# Patient Record
Sex: Female | Born: 1975 | Race: White | Hispanic: No | Marital: Married | Smoking: Current some day smoker
Health system: Southern US, Community
[De-identification: ages and names within clinical notes are randomized; demographics above are authoritative.]

## PROBLEM LIST (undated history)

## (undated) DIAGNOSIS — K603 Anal fistula, unspecified: Secondary | ICD-10-CM

## (undated) DIAGNOSIS — F419 Anxiety disorder, unspecified: Secondary | ICD-10-CM

## (undated) DIAGNOSIS — K219 Gastro-esophageal reflux disease without esophagitis: Secondary | ICD-10-CM

## (undated) DIAGNOSIS — T7840XA Allergy, unspecified, initial encounter: Secondary | ICD-10-CM

## (undated) HISTORY — DX: Anxiety disorder, unspecified: F41.9

## (undated) HISTORY — PX: LASER ABLATION: SHX1947

## (undated) HISTORY — DX: Anal fistula: K60.3

## (undated) HISTORY — DX: Allergy, unspecified, initial encounter: T78.40XA

## (undated) HISTORY — PX: TUBAL LIGATION: SHX77

## (undated) HISTORY — DX: Anal fistula, unspecified: K60.30

---

## 1998-06-11 ENCOUNTER — Other Ambulatory Visit: Admission: RE | Admit: 1998-06-11 | Discharge: 1998-06-11 | Payer: Self-pay | Admitting: Obstetrics and Gynecology

## 1998-10-29 ENCOUNTER — Ambulatory Visit (HOSPITAL_COMMUNITY): Admission: RE | Admit: 1998-10-29 | Discharge: 1998-10-29 | Payer: Self-pay | Admitting: Obstetrics and Gynecology

## 1998-10-29 ENCOUNTER — Encounter: Payer: Self-pay | Admitting: Obstetrics and Gynecology

## 1999-03-11 ENCOUNTER — Inpatient Hospital Stay (HOSPITAL_COMMUNITY): Admission: AD | Admit: 1999-03-11 | Discharge: 1999-03-11 | Payer: Self-pay | Admitting: Obstetrics and Gynecology

## 1999-03-27 ENCOUNTER — Inpatient Hospital Stay (HOSPITAL_COMMUNITY): Admission: AD | Admit: 1999-03-27 | Discharge: 1999-03-29 | Payer: Self-pay | Admitting: Obstetrics and Gynecology

## 1999-03-30 ENCOUNTER — Encounter: Admission: RE | Admit: 1999-03-30 | Discharge: 1999-05-18 | Payer: Self-pay | Admitting: Obstetrics and Gynecology

## 2000-05-30 ENCOUNTER — Other Ambulatory Visit: Admission: RE | Admit: 2000-05-30 | Discharge: 2000-05-30 | Payer: Self-pay | Admitting: Obstetrics and Gynecology

## 2000-11-15 ENCOUNTER — Encounter: Payer: Self-pay | Admitting: Internal Medicine

## 2000-11-15 ENCOUNTER — Ambulatory Visit (HOSPITAL_COMMUNITY): Admission: RE | Admit: 2000-11-15 | Discharge: 2000-11-15 | Payer: Self-pay | Admitting: Internal Medicine

## 2001-07-27 ENCOUNTER — Other Ambulatory Visit: Admission: RE | Admit: 2001-07-27 | Discharge: 2001-07-27 | Payer: Self-pay | Admitting: Obstetrics and Gynecology

## 2002-08-06 ENCOUNTER — Other Ambulatory Visit: Admission: RE | Admit: 2002-08-06 | Discharge: 2002-08-06 | Payer: Self-pay | Admitting: Obstetrics and Gynecology

## 2003-08-08 ENCOUNTER — Other Ambulatory Visit: Admission: RE | Admit: 2003-08-08 | Discharge: 2003-08-08 | Payer: Self-pay | Admitting: Obstetrics and Gynecology

## 2004-08-18 ENCOUNTER — Other Ambulatory Visit: Admission: RE | Admit: 2004-08-18 | Discharge: 2004-08-18 | Payer: Self-pay | Admitting: Obstetrics and Gynecology

## 2005-08-19 ENCOUNTER — Other Ambulatory Visit: Admission: RE | Admit: 2005-08-19 | Discharge: 2005-08-19 | Payer: Self-pay | Admitting: Obstetrics and Gynecology

## 2008-02-18 ENCOUNTER — Inpatient Hospital Stay (HOSPITAL_COMMUNITY): Admission: AD | Admit: 2008-02-18 | Discharge: 2008-02-18 | Payer: Self-pay | Admitting: Obstetrics and Gynecology

## 2008-05-28 ENCOUNTER — Ambulatory Visit (HOSPITAL_COMMUNITY): Admission: RE | Admit: 2008-05-28 | Discharge: 2008-05-28 | Payer: Self-pay | Admitting: Obstetrics and Gynecology

## 2010-03-01 HISTORY — PX: ANAL FISSURE REPAIR: SHX2312

## 2010-06-02 ENCOUNTER — Encounter (HOSPITAL_BASED_OUTPATIENT_CLINIC_OR_DEPARTMENT_OTHER)
Admission: RE | Admit: 2010-06-02 | Discharge: 2010-06-02 | Disposition: A | Discharge status: Home / Self Care | Payer: BC Managed Care – PPO | Source: Ambulatory Visit | Attending: General Surgery | Admitting: General Surgery

## 2010-06-02 LAB — DIFFERENTIAL
Basophils Relative: 0 % (ref 0–1)
Eosinophils Relative: 2 % (ref 0–5)
Monocytes Absolute: 0.6 10*3/uL (ref 0.1–1.0)
Monocytes Relative: 6 % (ref 3–12)
Neutro Abs: 7 10*3/uL (ref 1.7–7.7)

## 2010-06-02 LAB — CBC
HCT: 38.9 % (ref 36.0–46.0)
Hemoglobin: 12.9 g/dL (ref 12.0–15.0)
MCH: 28.7 pg (ref 26.0–34.0)
MCHC: 33.2 g/dL (ref 30.0–36.0)
RDW: 13.9 % (ref 11.5–15.5)

## 2010-06-04 ENCOUNTER — Ambulatory Visit (HOSPITAL_BASED_OUTPATIENT_CLINIC_OR_DEPARTMENT_OTHER)
Admission: RE | Admit: 2010-06-04 | Discharge: 2010-06-04 | Disposition: A | Discharge status: Home / Self Care | Payer: BC Managed Care – PPO | Source: Ambulatory Visit | Attending: General Surgery | Admitting: General Surgery

## 2010-06-04 DIAGNOSIS — K602 Anal fissure, unspecified: Secondary | ICD-10-CM | POA: Insufficient documentation

## 2010-06-11 LAB — CBC
HCT: 40.4 % (ref 36.0–46.0)
MCV: 89.6 fL (ref 78.0–100.0)
Platelets: 209 10*3/uL (ref 150–400)
RDW: 12.7 % (ref 11.5–15.5)

## 2010-06-11 LAB — PREGNANCY, URINE: Preg Test, Ur: NEGATIVE

## 2010-06-16 NOTE — Op Note (Signed)
  NAMECALLEN, ZUBA NO.:  [id number]  MEDICAL RECORD NO.:  [id number]          PATIENT TYPE:  LOCATION:                                 FACILITY:  PHYSICIAN:  Almond Lint, MD            DATE OF BIRTH:  DATE OF PROCEDURE:  06/04/2010 DATE OF DISCHARGE:                              OPERATIVE REPORT   PREOPERATIVE DIAGNOSIS:  Anal fissure.  POSTOPERATIVE DIAGNOSIS:  Anal fissure.  PROCEDURE PERFORMED:  Examine under anesthesia, lateral sphincterotomy and fissurectomy.  SURGEON:  Almond Lint, MD  ASSISTANT:  None.  ANESTHESIA:  General and local.  FINDINGS:  Posteriorly located chronic fissure.  SPECIMEN:  None.  ESTIMATED BLOOD LOSS:  Minimal.  COMPLICATIONS:  None known.  PROCEDURE:  Ms. Harland Dingwall was identified in the holding area was taken to the operating room where she was placed supine on the operating room table.  General anesthesia was induced.  She was placed into the low lithotomy position.  Her perineum was prepped and draped in standard fashion.  A time-out was performed according to the surgical safety check list.  When all was correct, we continued.  The anus was examined digitally first and there were no masses located.  With a speculum, there were seen to be circumferential noninflamed hemorrhoids. Posteriorly, there was a small fissure that was quite fibrotic and this was excised with Metzenbaum scissors.  The mucosa was reapproximated with 3-0 chromic.  At this point, a stab incision was made with a #11 blade on the left lateral aspect.  The fibrotic portion of the sphincter was identified and cut with Metzenbaum scissors.  The area was infiltrated with local anesthetic.  The anus was packed with Gelfoam with dibucaine ointment and the external portion of the procedure was covered with dibucaine ointment as well.  The patient was then awakened from anesthesia and taken to the PACU in stable condition.  Needle and sponge counts  were correct.     Almond Lint, MD     FB/MEDQ  D:  06/04/2010  T:  06/05/2010  Job:  [id number]  Electronically Signed by Almond Lint MD on 06/16/2010 09:44:16 AM

## 2010-07-14 NOTE — Op Note (Signed)
Amber Blankenship, Amber Blankenship NO.:  [id number]   MEDICAL RECORD NO.:  [id number]          PATIENT TYPE:  AMB   LOCATION:  SDC                           FACILITY:  [facility]   PHYSICIAN:  Zenaida Niece, M.D.DATE OF BIRTH:  January 22, 1976   DATE OF PROCEDURE:  05/28/2008  DATE OF DISCHARGE:                               OPERATIVE REPORT   PREOPERATIVE DIAGNOSIS:  Desires surgical sterility and menorrhagia.   POSTOPERATIVE DIAGNOSES:  1. Desires surgical sterility and menorrhagia.  2. Small uterine myoma.   PROCEDURE:  Laparoscopic bilateral tubal fulguration and NovaSure  endometrial ablation.   SURGEON:  Zenaida Niece, M.D.   ANESTHESIA:  General, endotracheal tube, and paracervical block.   FINDINGS:  She had normal abdomen and pelvis by laparoscopy, except for  a small fundal myoma.  The NovaSure device used a depth of 5 cm, a width  of 3.5 cm and used 96 watts per 65 seconds.   SPECIMENS:  None.   ESTIMATED BLOOD LOSS:  Minimal.   COMPLICATIONS:  None.   PROCEDURE IN DETAIL:  The patient was taken in the operating room and  placed in the dorsal supine position.  General anesthesia was induced,  left arm was took to her side and legs were placed in mobile stirrups.  Abdomen, perineum, and vagina were then prepped and draped in the usual  sterile fashion, bladder drained with a red Robinson catheter, Hulka  tenaculum applied to the cervix for uterine manipulation.  Infraumbilical skin was then infiltrated with 0.25% Marcaine and a 1 cm  vertical incision was made.  The Veress needle was then inserted into  the peritoneal cavity and placement confirmed by the water drop test and  an opening pressure of 5 mmHg.  CO2 gas was insufflated to a pressure of  14 mmHg and the Veress needle was removed.  A 10/11 disposable trocar  was then introduced with direct visualization with the laparoscope.  The  operating scope was then inserted.  Good visualization was achieved  with  normal pelvis and abdomen.  Both fallopian tubes were identified and  traced to their fimbriated ends.  The middle portion of each tube was  fulgurated with bipolar cautery at 3 separate spots until the amp meter  read 0 in all spots.  This achieved good fulguration.  Of note, she does  have a small anterior fundal serosal myoma.  At this point, the  laparoscopy portion was terminated.  The laparoscope was removed and all  gas allowed to deflate from the abdomen.  The trocar was removed.  Skin  incision was closed with interrupted subcuticular sutures of 4-0 Vicryl  followed by Dermabond.   Attention was turned to the NovaSure.  Legs were elevated in stirrups.  The Hulka tenaculum was removed.  A Graves speculum was inserted into  the vagina.  Deep paracervical block was then performed with 16 cc of 2%  plain lidocaine after a single-tooth tenaculum was placed on the  anterior portion of the cervix.  The uterus then sounded to 8.5 cm.  Cervix was gradually dilated to accommodate  a size 7 Hegar dilator,  which measured the cervix at 3.5 cm.  Cervix was further dilated to a  size 8 Hegar dilator which was then removed.  The NovaSure device was  easily inserted and appropriately deployed.  CO2 test passed and  endometrial ablation was performed without difficulty with the above-  mentioned settings.  The NovaSure device was then removed intact.  The  single-tooth tenaculum was removed from the cervix and bleeding was  controlled with pressure.  All instruments were then removed from the  vagina.  The patient tolerated the procedure well.  She was awakened in  the operating room and taken to the recovery room in stable condition.  Counts were correct x2 and she had PAS hose on throughout the procedure.      Zenaida Niece, M.D.  Electronically Signed     TDM/MEDQ  D:  05/28/2008  T:  05/29/2008  Job:  [id number]

## 2010-07-17 NOTE — Discharge Summary (Signed)
[facility]  Patient:    Amber Blankenship                       MRN: [id number] Adm. Date:  [id number] Disc. Date: [id number] Attending:  Malon Kindle                           Discharge Summary  HISTORY OF PRESENT ILLNESS:   A 35 year old white married female, para 1-0-0-1 gravida 2, last period June 19, 1998; [facility] March 28, 1999 by dates and March 31, 1999 by ultrasound; admitted with ruptured membranes with light meconium. Blood group and type O positive with a negative antibody, nonreactive serology,  rubella positive, hepatitis B surface antigen negative, HIV negative, GC and chlamydia negative, triple screen normal, one-hour Glucola 144, three-hour GTT 103/165/137/114.  Vaginal ultrasound on August 26, 1998 showed a crown/rump length of 2.2 cm, 9 weeks 0 days, [facility] March 31, 1999.  There was a 6 x 6 cm left ovarian cyst.  On September 29, 1998, left ovarian simple cyst was 5.8 x 5.5 cm.  On August 0, 2000, ultrasound showed an average gestational age of [redacted] weeks 4 days, [facility] March 29, 1999.  No ovarian cyst was seen.  The patient was treated with iron for anemia, and Zantac for reflux.  Vaginal infections were treated with Terazol and MetroGel. She was seen in the office on the day of admission, complaining of pelvic pressure. Cervix was a fingertip dilated, 30%, vertex, at a -3 station.  She was sent to he hospital for a nonstress test.  During the nonstress test, the patient had spontaneous rupture of membranes, with light meconium noted when I did an amniotomy on labor and delivery.  She began contracting every three minutes.  PAST MEDICAL HISTORY:  ALLERGIES:                    ENTEX LA and PENICILLIN.  ILLNESS:                      Condyloma.  OPERATIONS:                   None.  ALCOHOL, TOBACCO, AND DRUGS:  None.  INJURIES:                     Break in the third grade.  OBSTETRIC HISTORY:            March 1998 -  Vaginal delivery of an 8 pound 9 ounce female, increased blood pressure in pregnancy and positive group B strep.  PRESENTATION ON ADMISSION:    Her blood pressure was 134/83, pulse was 84 and temperature 97.6.  The abdomen was soft, the fundal height was 41 cm, fetal heart tones were normal.  The cervix was 4 cm, 70%, vertex, at a -3 station. In spite of prior spontaneous rupture of membranes, amniotomy was done for a bulging bag of waters, light meconium-stained fluid was found.  IMPRESSIONS:                  1. Intrauterine pregnancy at 39+ weeks.                               2. Early labor.  3. Prior positive group B strep.  HOSPITAL COURSE:              Patient was placed on IV clindamycin and given an  epidural.  She progressed to full dilatation, and showed a grapefruit-sized caput. A left mediolateral episiotomy was cut, and then with Woods screw maneuver, McRoberts maneuver, and suprapubic pressure, delivered a living female infant, 8 pounds 12 ounces, with Apgars of 8 at one and 9 at five minutes.  DeLee was used with the head on the perineum.  Only 2 cc of bloody mucous was obtained. Dr. Mikle Bosworth was in attendance, but because of the babys vigor did not look at the cords.  Placenta was intact.  Uterus was normal, rectal negative.  Left mediolateral episiotomy repaired with 3-0 Dexon.  Blood loss about 500 cc. Dr. Ambrose Mantle was in attendance.  Postpartum, the patient did quite well. However, on the day after delivery, the baby began to have some rapid breathing, and was evaluated and felt to be better served to be in the NICU, and will need to be in the hospital for at least seven days for treatment of infection.  The etiology f infection is unclear.  LABORATORY DATA:              On admission, the patients hemoglobin was 13.3; hematocrit 38.6; white count 10,800; platelet count 183,000.  RPR was nonreactive. Follow-up hemoglobin was 11.0;  hematocrit 31.3; platelet count 151,000; white count 14,700.  FINAL DIAGNOSES:              1. Intrauterine pregnancy at 39+ weeks, delivered                                  occiput anterior.                               2. Mild shoulder dystocia.                               3. Positive group B streptococcus.  OPERATIONS:                   1. Spontaneous delivery OA.                               2. Left mediolateral episiotomy and repair.  FINAL CONDITION:              Improved.  INSTRUCTIONS:                 Include our regular discharge instruction booklet.  MEDICATIONS:                  Tylenol #3 12 tablets one q.4-6h. if needed for pain but no refills.  FOLLOW-UP:                    The patient is advised to return to the office in six weeks for follow-up examination. DD:  03/29/99 TD:  03/30/99 Job: [id number]

## 2010-08-28 ENCOUNTER — Ambulatory Visit (INDEPENDENT_AMBULATORY_CARE_PROVIDER_SITE_OTHER): Payer: BC Managed Care – PPO | Admitting: General Surgery

## 2010-08-28 ENCOUNTER — Encounter (INDEPENDENT_AMBULATORY_CARE_PROVIDER_SITE_OTHER): Payer: Self-pay | Admitting: General Surgery

## 2010-08-28 DIAGNOSIS — K602 Anal fissure, unspecified: Secondary | ICD-10-CM | POA: Insufficient documentation

## 2010-08-28 DIAGNOSIS — K649 Unspecified hemorrhoids: Secondary | ICD-10-CM | POA: Insufficient documentation

## 2010-08-28 MED ORDER — HYDROCORTISONE 2.5 % RE CREA: TOPICAL_CREAM | Freq: Two times a day (BID) | RECTAL | Status: AC

## 2010-08-28 NOTE — Assessment & Plan Note (Signed)
Anusol HC BID. Continue stool softeners Sitz baths BID Follow up with me in 6 weeks

## 2010-08-28 NOTE — Progress Notes (Signed)
Subjective:     Patient ID: Amber Blankenship, female   DOB: [birth year], 35 y.o.   MRN: [id number]    Pulse 80  Temp 97.7 F (36.5 C)    HPI Patient is status post lateral sphincterotomy and fissurectomy on 45 2012 for anal fissure. She had been doing significantly better but now continues to have some soreness. She had improving significantly but now has started to bother her again. She has had some bleeding and some swelling. She has been aggressive with her stool softeners and is not constipated . She does strain to have bowel movements and is not spent a lot of time on the toilet. She is knowing her having had a sharp stabbing pain that she had with her fissure.   History reviewed. No pertinent past medical history.  Past Surgical History  Procedure Date  . Bilateral spihincterotomy     Outpatient Encounter Prescriptions as of 08/28/2010  Medication Sig Dispense Refill  . PHENTERMINE HCL PO Take by mouth daily.        . hydrocortisone (ANUSOL-HC) 2.5 % rectal cream Place rectally 2 (two) times daily.  60 g  0    Allergies  Allergen Reactions  . Penicillins Itching    History reviewed. No pertinent family history.  History   Social History  . Marital Status: Divorced    Spouse Name: N/A    Number of Children: N/A  . Years of Education: N/A   Occupational History  . Not on file.   Social History Main Topics  . Smoking status: Former Games developer  . Smokeless tobacco: Not on file  . Alcohol Use: No  . Drug Use: No  . Sexually Active:    Other Topics Concern  . Not on file   Social History Narrative  . No narrative on file    Review of Systems     Objective:   Physical Exam  Constitutional: She appears well-developed and well-nourished.  perianal exam demonstrates external hemorrhoids on the right     Assessment:       Plan:

## 2010-10-14 ENCOUNTER — Encounter (INDEPENDENT_AMBULATORY_CARE_PROVIDER_SITE_OTHER): Payer: Self-pay | Admitting: General Surgery

## 2010-10-14 ENCOUNTER — Ambulatory Visit (INDEPENDENT_AMBULATORY_CARE_PROVIDER_SITE_OTHER): Payer: BC Managed Care – PPO | Admitting: General Surgery

## 2010-10-14 ENCOUNTER — Encounter (INDEPENDENT_AMBULATORY_CARE_PROVIDER_SITE_OTHER): Payer: Self-pay

## 2010-10-14 DIAGNOSIS — K649 Unspecified hemorrhoids: Secondary | ICD-10-CM

## 2010-10-14 NOTE — Assessment & Plan Note (Signed)
Skin overlying hemorrhoids appear raw at the anal verge.   Apply desitin or other diaper rash cream BID or PRN. Pain mostly occurs with wiping, so use water bottle to clean after bowel movement and dab to clean.

## 2010-10-14 NOTE — Progress Notes (Signed)
Amber Blankenship is a 35 y.o. female.    Chief Complaint  Patient presents with  . Other    PO sphincterectomy    HPI HPI Amber Blankenship is still having pain with bowel movements, occasionally a 7-8/10, usually a 3-5/10.  Amber Blankenship has been taking 250 mg colace per day.  Amber Blankenship is only having a hard stool occasionally and has 2-3/day.  Amber Blankenship denies sitting on the toilet for extended periods of time.  Amber Blankenship has been using the hemorrhoid cream I prescribed.    Past Medical History  Diagnosis Date  . Hemorrhoids   . Anxiety   . Anal fistula     Past Surgical History  Procedure Date  . Bilateral spihincterotomy   . Tubal ligation     History reviewed. No pertinent family history.  Social History History  Substance Use Topics  . Smoking status: Former Games developer  . Smokeless tobacco: Not on file  . Alcohol Use: No    Allergies  Allergen Reactions  . Penicillins Itching    Current Outpatient Prescriptions  Medication Sig Dispense Refill  . PROCTOSOL HC 2.5 % rectal cream Ad lib.        Review of Systems ROS  Physical Exam Physical Exam  Constitutional: Amber Blankenship is oriented to person, place, and time. Amber Blankenship appears well-developed and well-nourished. No distress.  HENT:  Head: Normocephalic and atraumatic.  Eyes: Conjunctivae are normal. Pupils are equal, round, and reactive to light. No scleral icterus.  Respiratory: Effort normal. No respiratory distress.  Genitourinary: Rectal exam shows internal hemorrhoid (R hemorrhoids with irritation at anal verge.).  Neurological: Amber Blankenship is alert and oriented to person, place, and time. Coordination normal.  Skin: Skin is warm and dry. No rash noted. Amber Blankenship is not diaphoretic. No erythema.  Psychiatric: Amber Blankenship has a normal mood and affect. Amber Blankenship behavior is normal. Judgment and thought content normal.     There were no vitals taken for this visit.  Assessment/Plan Hemorrhoids Skin overlying hemorrhoids appear raw at the anal verge.   Apply desitin or other  diaper rash cream BID or PRN. Pain mostly occurs with wiping, so use water bottle to clean after bowel movement and dab to clean.         Amber Blankenship 10/14/2010, 11:21 AM

## 2010-12-04 LAB — COMPREHENSIVE METABOLIC PANEL
ALT: 13 U/L (ref 0–35)
AST: 16 U/L (ref 0–37)
Albumin: 3.1 g/dL — ABNORMAL LOW (ref 3.5–5.2)
Alkaline Phosphatase: 64 U/L (ref 39–117)
Potassium: 4 mEq/L (ref 3.5–5.1)
Sodium: 136 mEq/L (ref 135–145)
Total Protein: 6.3 g/dL (ref 6.0–8.3)

## 2010-12-04 LAB — URINALYSIS, ROUTINE W REFLEX MICROSCOPIC
Glucose, UA: NEGATIVE mg/dL
Ketones, ur: NEGATIVE mg/dL
Protein, ur: NEGATIVE mg/dL

## 2010-12-04 LAB — CBC
Platelets: 197 10*3/uL (ref 150–400)
RDW: 13.9 % (ref 11.5–15.5)

## 2010-12-04 LAB — URINE MICROSCOPIC-ADD ON

## 2011-06-15 ENCOUNTER — Encounter: Payer: Self-pay | Admitting: Urgent Care

## 2011-06-15 ENCOUNTER — Ambulatory Visit (INDEPENDENT_AMBULATORY_CARE_PROVIDER_SITE_OTHER): Payer: BC Managed Care – PPO | Admitting: Urgent Care

## 2011-06-15 VITALS — BP 110/70 | HR 69 | Temp 98.0°F | Ht 65.0 in | Wt 199.8 lb

## 2011-06-15 DIAGNOSIS — R109 Unspecified abdominal pain: Secondary | ICD-10-CM

## 2011-06-15 DIAGNOSIS — R1010 Upper abdominal pain, unspecified: Secondary | ICD-10-CM

## 2011-06-15 DIAGNOSIS — K219 Gastro-esophageal reflux disease without esophagitis: Secondary | ICD-10-CM

## 2011-06-15 NOTE — Assessment & Plan Note (Addendum)
Amber Blankenship is a pleasant 36 y.o. female with acute onset severe post-prandial upper abdominal & chest pain.  Differentials include gallbladder disease, GERD, gastritis, PUD or pancreatitis.  Begin Nexium 40mg  daily Abdominal ultrasound ASAP CBC, LFTS, lipase now ABD Pain info  To ER if severe pain, nausea or vomiting.

## 2011-06-15 NOTE — Assessment & Plan Note (Signed)
Minimal heartburn couple times per month.  Now w/ acute onset upper abdominal pain.

## 2011-06-15 NOTE — Progress Notes (Signed)
Primary Care Physician:  Lynne Leader, MD, MD Primary Gastroenterologist:  Dr. Jena Gauss  Chief Complaint  Patient presents with  . Gastrophageal Reflux    pain in the center of chest     HPI:  Amber Blankenship is a 36 y.o. female here as a new patient for evaluation of acute upper abdominal pain.  She tells me shortly after dinner last pm, she developed a severe sharp pain.  She had previously had occasional heartburn, but this is nothing like she has had before.  She ate beef roast & potatoes.  c/o persistent chest tightness & a sharp pain just below her bra, worse on the right.  The pain brought tears to hear eyes.  Dull epigastric pain persists today.  She had a normal BM this am.  She took Zantac last night with minimal help.  C/o SOB last night, but this has resolved today.  c/o nausea, but no vomiting.  No rectal bleeding or melena.  Appetite had been ok before this pain, but now with anorexia.  No fever. + chills.  Weight has been stable.     Past Medical History  Diagnosis Date  . Hemorrhoids   . Anxiety   . Anal fistula     Past Surgical History  Procedure Date  . Tubal ligation   . Laser ablation     uterine    Current Outpatient Prescriptions  Medication Sig Dispense Refill  . docusate sodium (COLACE) 100 MG capsule Take 100 mg by mouth 2 (two) times daily.      . polycarbophil (FIBERCON) 625 MG tablet Take 625 mg by mouth daily.      . ranitidine (ZANTAC) 150 MG capsule Take 150 mg by mouth 2 (two) times daily.        Allergies as of 06/15/2011 - Review Complete 06/15/2011  Allergen Reaction Noted  . Penicillins Itching 08/28/2010    Family History:  There is no known family history of colorectal carcinoma , liver disease, or inflammatory bowel disease.  History   Social History  . Marital Status: Married    Spouse Name: N/A    Number of Children: 2  . Years of Education: N/A   Occupational History  . 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 Smoking status: Never Smoker   . Smokeless tobacco: Not on file  . Alcohol Use: No  . Drug Use: No  . Sexually Active: Not on file  Review of Systems: Gen: See HPI CV: Denies palpitations, syncope, orthopnea, PND, peripheral edema, and claudication. Resp: Denies dyspnea at rest, dyspnea with exercise, cough, sputum, wheezing, coughing up blood, and pleurisy. GI: Denies vomiting blood, jaundice, and fecal incontinence.Denies dysphagia or odynophagia. GU : Denies urinary burning, blood in urine, urinary frequency, urinary hesitancy, nocturnal urination, and urinary incontinence. MS: Denies joint pain, limitation of movement, and swelling, stiffness, low back pain, extremity pain. Denies muscle weakness, cramps, atrophy.  Derm: Denies rash, itching, dry skin, hives, moles, warts, or unhealing ulcers.  Psych: Denies depression, anxiety, memory loss, suicidal ideation, hallucinations, paranoia, and confusion. Heme: Denies bruising, bleeding, and enlarged lymph nodes. Neuro:  Denies any headaches, dizziness, paresthesias. Endo:  Denies any problems with DM, thyroid, adrenal function.  Physical Exam: BP 110/70  Pulse 69  Temp(Src) 98 F (36.7 C) (Temporal)  Ht 5\' 5"  (1.651 m)  Wt 199 lb 12.8 oz (90.629 kg)  BMI 33.25 kg/m2 General:   Alert,  Well-developed, well-nourished, pleasant and  cooperative in NAD Head:  Normocephalic and atraumatic. Eyes:  Sclera clear, no icterus.   Conjunctiva pink. Ears:  Normal auditory acuity. Nose:  No deformity, discharge, or lesions. Mouth:  No deformity or lesions,oropharynx pink & moist. Neck:  Supple; no masses or thyromegaly. Lungs:  Clear throughout to auscultation.   No wheezes, crackles, or rhonchi. No acute distress. Heart:  Regular rate and rhythm; no murmurs, clicks, rubs,  or gallops. Abdomen:  Normal bowel sounds.  No bruits.  Soft and non-distended without masses, hepatosplenomegaly or hernias noted.  +Murphy's point  tenderness.  No guarding or rebound tenderness.   Rectal:  Deferred. Msk:  Symmetrical without gross deformities. Normal posture. Pulses:  Normal pulses noted. Extremities:  No clubbing or edema. Neurologic:  Alert and  oriented x4;  grossly normal neurologically. Skin:  Intact without significant lesions or rashes. Lymph Nodes:  No significant cervical adenopathy. Psych:  Alert and cooperative. Normal mood and affect.

## 2011-06-15 NOTE — Patient Instructions (Signed)
Begin Nexium 40mg  daily Go to lab now I will call you with ultrasound & lab results Abdominal Pain Abdominal pain can be caused by many things. Your caregiver decides the seriousness of your pain by an examination and possibly blood tests and X-rays. Many cases can be observed and treated at home. Most abdominal pain is not caused by a disease and will probably improve without treatment. However, in many cases, more time must pass before a clear cause of the pain can be found. Before that point, it may not be known if you need more testing, or if hospitalization or surgery is needed. HOME CARE INSTRUCTIONS   Do not take laxatives unless directed by your caregiver.   Take pain medicine only as directed by your caregiver.   Only take over-the-counter or prescription medicines for pain, discomfort, or fever as directed by your caregiver.   Try a clear liquid diet (broth, tea, or water) for as long as directed by your caregiver. Slowly move to a bland diet as tolerated.  SEEK IMMEDIATE MEDICAL CARE IF:   The pain does not go away.   You have a fever.   You keep throwing up (vomiting).   The pain is felt only in portions of the abdomen. Pain in the right side could possibly be appendicitis. In an adult, pain in the left lower portion of the abdomen could be colitis or diverticulitis.   You pass bloody or black tarry stools.  MAKE SURE YOU:   Understand these instructions.   Will watch your condition.   Will get help right away if you are not doing well or get worse.  Document Released: 11/25/2004 Document Revised: 02/04/2011 Document Reviewed: 10/04/2007 [facility] Patient Information 2012 [place], [place].

## 2011-06-16 ENCOUNTER — Ambulatory Visit (HOSPITAL_COMMUNITY)
Admission: RE | Admit: 2011-06-16 | Discharge: 2011-06-16 | Disposition: A | Discharge status: Home / Self Care | Payer: BC Managed Care – PPO | Source: Ambulatory Visit | Attending: Urgent Care | Admitting: Urgent Care

## 2011-06-16 ENCOUNTER — Telehealth: Payer: Self-pay | Admitting: Internal Medicine

## 2011-06-16 ENCOUNTER — Other Ambulatory Visit: Payer: Self-pay | Admitting: Gastroenterology

## 2011-06-16 DIAGNOSIS — R1011 Right upper quadrant pain: Secondary | ICD-10-CM | POA: Insufficient documentation

## 2011-06-16 DIAGNOSIS — R1010 Upper abdominal pain, unspecified: Secondary | ICD-10-CM

## 2011-06-16 DIAGNOSIS — R935 Abnormal findings on diagnostic imaging of other abdominal regions, including retroperitoneum: Secondary | ICD-10-CM | POA: Insufficient documentation

## 2011-06-16 LAB — CBC WITH DIFFERENTIAL/PLATELET
Eosinophils Absolute: 0.1 10*3/uL (ref 0.0–0.7)
Hemoglobin: 13.3 g/dL (ref 12.0–15.0)
Lymphocytes Relative: 30 % (ref 12–46)
Lymphs Abs: 2.2 10*3/uL (ref 0.7–4.0)
MCH: 29.5 pg (ref 26.0–34.0)
Monocytes Relative: 5 % (ref 3–12)
Neutro Abs: 4.6 10*3/uL (ref 1.7–7.7)
Neutrophils Relative %: 62 % (ref 43–77)
RBC: 4.51 MIL/uL (ref 3.87–5.11)

## 2011-06-16 LAB — LIPASE: Lipase: 18 U/L (ref 0–75)

## 2011-06-16 LAB — HEPATIC FUNCTION PANEL
Bilirubin, Direct: 0.1 mg/dL (ref 0.0–0.3)
Indirect Bilirubin: 0.2 mg/dL (ref 0.0–0.9)
Total Protein: 6.7 g/dL (ref 6.0–8.3)

## 2011-06-16 NOTE — Progress Notes (Signed)
Results Cc to PCP  

## 2011-06-16 NOTE — Telephone Encounter (Signed)
Noted  

## 2011-06-16 NOTE — Progress Notes (Signed)
Quick Note:  Please call patient. Her ultrasound shows a normal gallbladder. She does have fatty liver. Her lab work was normal. If she is still with abdominal pain, please arrange a HIDA scan. Instructions for fatty liver: Recommend 1-2# weight loss per week until ideal body weight through exercise & diet. Low fat/cholesterol diet. Gradually increase exercise from 15 min daily up to 1 hr per day 5 days/week. Limit alcohol use. Thanks CC: ELMAHDY,WAGDY, MD   ______

## 2011-06-16 NOTE — Progress Notes (Signed)
Quick Note:  Await ultrasound. Labs are normal. CC: ELMAHDY,WAGDY, MD  ______

## 2011-06-16 NOTE — Progress Notes (Signed)
Quick Note:  Pt aware, she has multiple questions about the hida scan. Crystal will explain scan to pt.   Please cc pcp ______

## 2011-06-16 NOTE — Progress Notes (Signed)
Faxed to PCP

## 2011-06-16 NOTE — Progress Notes (Signed)
Quick Note:  Tried to call pt- LMOM at [phone] ______

## 2011-06-16 NOTE — Telephone Encounter (Signed)
Pt was seen yesterday and gave me her cell number [phone] to call her when we get her results back from U/S.

## 2011-06-18 ENCOUNTER — Encounter (HOSPITAL_COMMUNITY): Payer: Self-pay

## 2011-06-18 ENCOUNTER — Encounter (HOSPITAL_COMMUNITY)
Admission: RE | Admit: 2011-06-18 | Discharge: 2011-06-18 | Disposition: A | Discharge status: Home / Self Care | Payer: BC Managed Care – PPO | Source: Ambulatory Visit | Attending: Urgent Care | Admitting: Urgent Care

## 2011-06-18 DIAGNOSIS — R1011 Right upper quadrant pain: Secondary | ICD-10-CM | POA: Insufficient documentation

## 2011-06-18 DIAGNOSIS — R110 Nausea: Secondary | ICD-10-CM | POA: Insufficient documentation

## 2011-06-18 MED ORDER — TECHNETIUM TC 99M MEBROFENIN IV KIT
5.0000 | PACK | Freq: Once | INTRAVENOUS | Status: AC | PRN
  Administered 2011-06-18: 5.5 via INTRAVENOUS

## 2011-06-18 MED ORDER — SINCALIDE 5 MCG IJ SOLR
0.0200 ug/kg | Freq: Once | INTRAMUSCULAR | Status: AC
  Administered 2011-06-18: 1.82 ug via INTRAVENOUS

## 2011-06-18 NOTE — Progress Notes (Signed)
Quick Note:  Please call pt. No evidence of gallbladder disease on HIDA. How is she feeling? If still w/ epigastric pain, heartburn & nausea, she will need EGD w/ Dr Jena Gauss Thanks OZ:[email or web address],IONGE, MD  ______

## 2011-06-22 ENCOUNTER — Telehealth: Payer: Self-pay | Admitting: Urgent Care

## 2011-06-22 MED ORDER — ESOMEPRAZOLE MAGNESIUM 40 MG PO CPDR: 40.0000 mg | DELAYED_RELEASE_CAPSULE | Freq: Every day | ORAL | Status: DC

## 2011-06-22 NOTE — Telephone Encounter (Signed)
Rx sent to [facility] Patient to have EGD in near future

## 2011-06-22 NOTE — Progress Notes (Signed)
Quick Note:    Noted    ______

## 2012-01-10 ENCOUNTER — Encounter (HOSPITAL_COMMUNITY): Payer: Self-pay | Admitting: Pharmacist

## 2012-01-19 ENCOUNTER — Encounter (HOSPITAL_COMMUNITY): Payer: Self-pay

## 2012-01-19 ENCOUNTER — Encounter (HOSPITAL_COMMUNITY)
Admission: RE | Admit: 2012-01-19 | Discharge: 2012-01-19 | Disposition: A | Discharge status: Home / Self Care | Payer: BC Managed Care – PPO | Source: Ambulatory Visit | Attending: Obstetrics and Gynecology | Admitting: Obstetrics and Gynecology

## 2012-01-19 LAB — CBC
HCT: 40.8 % (ref 36.0–46.0)
MCH: 28.7 pg (ref 26.0–34.0)
MCV: 87.9 fL (ref 78.0–100.0)
RBC: 4.64 MIL/uL (ref 3.87–5.11)
RDW: 14.2 % (ref 11.5–15.5)
WBC: 8.2 10*3/uL (ref 4.0–10.5)

## 2012-01-19 NOTE — Patient Instructions (Addendum)
20 KHRYSTINA BONNES  01/19/2012   Your procedure is scheduled on:  01/24/12  Enter through the Main Entrance of [facility] at 6 AM.  Pick up the phone at the desk and dial [phone].   Call this number if you have problems the morning of surgery: [phone]   Remember:   Do not eat food:After Midnight.  Do not drink clear liquids: After Midnight.  Take these medicines the morning of surgery with A SIP OF WATER: Nexium   Do not wear jewelry, make-up or nail polish.  Do not wear lotions, powders, or perfumes. You may wear deodorant.  Do not shave 48 hours prior to surgery.  Do not bring valuables to the hospital.  Contacts, dentures or bridgework may not be worn into surgery.  Leave suitcase in the car. After surgery it may be brought to your room.  For patients admitted to the hospital, checkout time is 11:00 AM the day of discharge.   Patients discharged the day of surgery will not be allowed to drive home.  Name and phone number of your driver: NA  Special Instructions: Shower using CHG 2 nights before surgery and the night before surgery.  If you shower the day of surgery use CHG.  Use special wash - you have one bottle of CHG for all showers.  You should use approximately 1/3 of the bottle for each shower.   Please read over the following fact sheets that you were given: Surgical Site Infection Prevention

## 2012-01-23 MED ORDER — CLINDAMYCIN PHOSPHATE 900 MG/50ML IV SOLN
900.0000 mg | INTRAVENOUS | Status: AC
  Administered 2012-01-24: 900 mg via INTRAVENOUS
  Filled 2012-01-23: qty 50

## 2012-01-23 MED ORDER — CIPROFLOXACIN IN D5W 400 MG/200ML IV SOLN
400.0000 mg | INTRAVENOUS | Status: AC
  Administered 2012-01-24: 400 mg via INTRAVENOUS
  Filled 2012-01-23: qty 200

## 2012-01-24 ENCOUNTER — Encounter (HOSPITAL_COMMUNITY): Payer: Self-pay | Admitting: Anesthesiology

## 2012-01-24 ENCOUNTER — Encounter (HOSPITAL_COMMUNITY)
Admission: RE | Disposition: A | Discharge status: Home / Self Care | Payer: Self-pay | Source: Ambulatory Visit | Attending: Obstetrics and Gynecology

## 2012-01-24 ENCOUNTER — Ambulatory Visit (HOSPITAL_COMMUNITY): Payer: BC Managed Care – PPO | Admitting: Anesthesiology

## 2012-01-24 ENCOUNTER — Ambulatory Visit (HOSPITAL_COMMUNITY)
Admission: RE | Admit: 2012-01-24 | Discharge: 2012-01-24 | Disposition: A | Discharge status: Home / Self Care | Payer: BC Managed Care – PPO | Source: Ambulatory Visit | Attending: Obstetrics and Gynecology | Admitting: Obstetrics and Gynecology

## 2012-01-24 DIAGNOSIS — N3641 Hypermobility of urethra: Secondary | ICD-10-CM | POA: Insufficient documentation

## 2012-01-24 DIAGNOSIS — F411 Generalized anxiety disorder: Secondary | ICD-10-CM | POA: Insufficient documentation

## 2012-01-24 DIAGNOSIS — Z880 Allergy status to penicillin: Secondary | ICD-10-CM | POA: Insufficient documentation

## 2012-01-24 DIAGNOSIS — N393 Stress incontinence (female) (male): Secondary | ICD-10-CM | POA: Diagnosis present

## 2012-01-24 DIAGNOSIS — Z01812 Encounter for preprocedural laboratory examination: Secondary | ICD-10-CM | POA: Insufficient documentation

## 2012-01-24 DIAGNOSIS — K219 Gastro-esophageal reflux disease without esophagitis: Secondary | ICD-10-CM | POA: Insufficient documentation

## 2012-01-24 HISTORY — PX: BLADDER SUSPENSION: SHX72

## 2012-01-24 SURGERY — URETHROPEXY, USING TRANSVAGINAL TAPE
Anesthesia: General | Site: Vagina | Wound class: Clean Contaminated

## 2012-01-24 MED ORDER — STERILE WATER FOR IRRIGATION IR SOLN
Status: DC | PRN
  Administered 2012-01-24: 1000 mL

## 2012-01-24 MED ORDER — FENTANYL CITRATE 0.05 MG/ML IJ SOLN
INTRAMUSCULAR | Status: AC
  Filled 2012-01-24: qty 2

## 2012-01-24 MED ORDER — KETOROLAC TROMETHAMINE 30 MG/ML IJ SOLN: 15.0000 mg | Freq: Once | INTRAMUSCULAR | Status: DC | PRN

## 2012-01-24 MED ORDER — KETOROLAC TROMETHAMINE 30 MG/ML IJ SOLN
INTRAMUSCULAR | Status: AC
  Filled 2012-01-24: qty 2

## 2012-01-24 MED ORDER — KETOROLAC TROMETHAMINE 30 MG/ML IJ SOLN
INTRAMUSCULAR | Status: DC | PRN
  Administered 2012-01-24 (×2): 30 mg via INTRAVENOUS

## 2012-01-24 MED ORDER — MIDAZOLAM HCL 5 MG/5ML IJ SOLN
INTRAMUSCULAR | Status: DC | PRN
  Administered 2012-01-24: 2 mg via INTRAVENOUS

## 2012-01-24 MED ORDER — BUPIVACAINE-EPINEPHRINE (PF) 0.5% -1:200000 IJ SOLN
INTRAMUSCULAR | Status: AC
  Filled 2012-01-24: qty 10

## 2012-01-24 MED ORDER — MEPERIDINE HCL 25 MG/ML IJ SOLN: 6.2500 mg | INTRAMUSCULAR | Status: DC | PRN

## 2012-01-24 MED ORDER — ESTRADIOL 0.1 MG/GM VA CREA
TOPICAL_CREAM | VAGINAL | Status: AC
  Filled 2012-01-24: qty 42.5

## 2012-01-24 MED ORDER — MIDAZOLAM HCL 2 MG/2ML IJ SOLN: 0.5000 mg | Freq: Once | INTRAMUSCULAR | Status: DC | PRN

## 2012-01-24 MED ORDER — GLYCOPYRROLATE 0.2 MG/ML IJ SOLN
INTRAMUSCULAR | Status: AC
  Filled 2012-01-24: qty 1

## 2012-01-24 MED ORDER — ONDANSETRON HCL 4 MG/2ML IJ SOLN
INTRAMUSCULAR | Status: DC | PRN
  Administered 2012-01-24: 4 mg via INTRAVENOUS

## 2012-01-24 MED ORDER — PROPOFOL 10 MG/ML IV EMUL
INTRAVENOUS | Status: DC | PRN
  Administered 2012-01-24: 170 mg via INTRAVENOUS

## 2012-01-24 MED ORDER — BUPIVACAINE-EPINEPHRINE 0.5% -1:200000 IJ SOLN
INTRAMUSCULAR | Status: DC | PRN
  Administered 2012-01-24: 10 mL

## 2012-01-24 MED ORDER — LIDOCAINE HCL (CARDIAC) 20 MG/ML IV SOLN
INTRAVENOUS | Status: DC | PRN
  Administered 2012-01-24: 30 mg via INTRAVENOUS
  Administered 2012-01-24: 20 mg via INTRAVENOUS

## 2012-01-24 MED ORDER — PROMETHAZINE HCL 25 MG/ML IJ SOLN: 6.2500 mg | INTRAMUSCULAR | Status: DC | PRN

## 2012-01-24 MED ORDER — MIDAZOLAM HCL 2 MG/2ML IJ SOLN
INTRAMUSCULAR | Status: AC
  Filled 2012-01-24: qty 2

## 2012-01-24 MED ORDER — LACTATED RINGERS IV SOLN: INTRAVENOUS | Status: DC

## 2012-01-24 MED ORDER — LIDOCAINE HCL (CARDIAC) 20 MG/ML IV SOLN
INTRAVENOUS | Status: AC
  Filled 2012-01-24: qty 5

## 2012-01-24 MED ORDER — PROPOFOL 10 MG/ML IV EMUL
INTRAVENOUS | Status: AC
  Filled 2012-01-24: qty 20

## 2012-01-24 MED ORDER — GLYCOPYRROLATE 0.2 MG/ML IJ SOLN
INTRAMUSCULAR | Status: DC | PRN
  Administered 2012-01-24: 0.3 mg via INTRAVENOUS

## 2012-01-24 MED ORDER — LEVOFLOXACIN 500 MG PO TABS: 500.0000 mg | ORAL_TABLET | Freq: Every day | ORAL | Status: DC

## 2012-01-24 MED ORDER — LACTATED RINGERS IV SOLN
INTRAVENOUS | Status: DC
  Administered 2012-01-24 (×2): via INTRAVENOUS

## 2012-01-24 MED ORDER — ONDANSETRON HCL 4 MG/2ML IJ SOLN
INTRAMUSCULAR | Status: AC
  Filled 2012-01-24: qty 2

## 2012-01-24 MED ORDER — FENTANYL CITRATE 0.05 MG/ML IJ SOLN
INTRAMUSCULAR | Status: DC | PRN
  Administered 2012-01-24: 50 ug via INTRAVENOUS

## 2012-01-24 MED ORDER — FENTANYL CITRATE 0.05 MG/ML IJ SOLN: 25.0000 ug | INTRAMUSCULAR | Status: DC | PRN

## 2012-01-24 MED ORDER — OXYCODONE-ACETAMINOPHEN 5-325 MG PO TABS: 1.0000 | ORAL_TABLET | ORAL | Status: DC | PRN

## 2012-01-24 SURGICAL SUPPLY — 24 items
ADH SKN CLS APL DERMABOND .7 (GAUZE/BANDAGES/DRESSINGS) ×1
BLADE SURG 15 STRL LF C SS BP (BLADE) ×1 IMPLANT
BLADE SURG 15 STRL SS (BLADE) ×2
CANISTER SUCTION 2500CC (MISCELLANEOUS) ×2 IMPLANT
CATH ROBINSON RED A/P 16FR (CATHETERS) ×2 IMPLANT
CLOTH BEACON ORANGE TIMEOUT ST (SAFETY) ×2 IMPLANT
DECANTER SPIKE VIAL GLASS SM (MISCELLANEOUS) IMPLANT
DERMABOND ADVANCED (GAUZE/BANDAGES/DRESSINGS) ×1
DERMABOND ADVANCED .7 DNX12 (GAUZE/BANDAGES/DRESSINGS) IMPLANT
DRAPE HYSTEROSCOPY (DRAPE) ×2 IMPLANT
GAUZE PACKING 2X5 YD STERILE (GAUZE/BANDAGES/DRESSINGS) IMPLANT
GLOVE BIO SURGEON STRL SZ8 (GLOVE) ×2 IMPLANT
GLOVE ORTHO TXT STRL SZ7.5 (GLOVE) ×2 IMPLANT
GOWN PREVENTION PLUS LG XLONG (DISPOSABLE) ×4 IMPLANT
NEEDLE HYPO 22GX1.5 SAFETY (NEEDLE) ×2 IMPLANT
NS IRRIG 1000ML POUR BTL (IV SOLUTION) ×2 IMPLANT
PACK VAGINAL WOMENS (CUSTOM PROCEDURE TRAY) ×2 IMPLANT
SET CYSTO W/LG BORE CLAMP LF (SET/KITS/TRAYS/PACK) ×2 IMPLANT
SLING SOLYX SYSTEM SIS BX (SLING) IMPLANT
SUT VIC AB 2-0 CT1 (SUTURE) ×8 IMPLANT
SUT VIC AB 2-0 CT2 27 (SUTURE) IMPLANT
TOWEL OR 17X24 6PK STRL BLUE (TOWEL DISPOSABLE) ×4 IMPLANT
TRAY FOLEY CATH 14FR (SET/KITS/TRAYS/PACK) ×2 IMPLANT
WATER STERILE IRR 1000ML POUR (IV SOLUTION) ×2 IMPLANT

## 2012-01-24 NOTE — Interval H&P Note (Signed)
History and Physical Interval Note:  01/24/2012 7:10 AM  Amber Blankenship  has presented today for surgery, with the diagnosis of SUI  The various methods of treatment have been discussed with the patient and family. After consideration of risks, benefits and other options for treatment, the patient has consented to  Procedure(s) (LRB) with comments: TRANSVAGINAL TAPE (TVT) PROCEDURE (N/A) - Solyx sling, possible TVT as a surgical intervention .  The patient's history has been reviewed, patient examined, no change in status, stable for surgery.  I have reviewed the patient's chart and labs.  Questions were answered to the patient's satisfaction.     [name] D

## 2012-01-24 NOTE — Anesthesia Postprocedure Evaluation (Signed)
  Anesthesia Post-op Note  Patient: Amber Blankenship  Procedure(s) Performed: Procedure(s) (LRB) with comments: TRANSVAGINAL TAPE (TVT) PROCEDURE (N/A) - Solyx sling, possible TVT  Patient Location: PACU  Anesthesia Type:General  Level of Consciousness: awake, alert  and oriented  Airway and Oxygen Therapy: Patient Spontanous Breathing  Post-op Pain: none  Post-op Assessment: Post-op Vital signs reviewed, Patient's Cardiovascular Status Stable, Respiratory Function Stable, Patent Airway, No signs of Nausea or vomiting and Pain level controlled  Post-op Vital Signs: Reviewed and stable  Complications: No apparent anesthesia complications

## 2012-01-24 NOTE — H&P (Signed)
Amber Blankenship is an 36 y.o. female. She was seen for an annual exam in August and c/o worsening SUI.  Recent urodynamics confirm SUI, no evidence of curretn OAB, and she wants surgery to try to fix this.  Pertinent Gynecological History: Last pap: normal with neg HPV    Date: 2011 OB History: G2, P2002   Menstrual History: No LMP recorded. Patient has had an ablation.    Past Medical History  Diagnosis Date  . Hemorrhoids   . Anxiety   . Anal fistula     Past Surgical History  Procedure Date  . Tubal ligation   . Laser ablation     uterine  . Anal fissure repair 2012  Novasure, not laser ablation  No family history on file.  Social History:  reports that she has never smoked. She does not have any smokeless tobacco history on file. She reports that she does not drink alcohol or use illicit drugs.  Allergies:  Allergies  Allergen Reactions  . Penicillins Itching    Prescriptions prior to admission  Medication Sig Dispense Refill  . docusate sodium (COLACE) 100 MG capsule Take 100 mg by mouth 2 (two) times daily.      Marland Kitchen esomeprazole (NEXIUM) 40 MG capsule Take 1 capsule (40 mg total) by mouth daily.  31 capsule  2  . polycarbophil (FIBERCON) 625 MG tablet Take 625 mg by mouth daily.      . sertraline (ZOLOFT) 25 MG tablet Take 25 mg by mouth daily.        Review of Systems  Respiratory: Negative.   Cardiovascular: Negative.   Gastrointestinal: Negative.   Genitourinary: Negative.     Blood pressure 132/79, pulse 76, temperature 98.2 F (36.8 C), temperature source Oral, resp. rate 18, height 5\' 4"  (1.626 m), weight 86.183 kg (190 lb), SpO2 100.00%. Physical Exam  Constitutional: She appears well-developed and well-nourished.  Neck: Neck supple. No thyromegaly present.  Cardiovascular: Normal rate, regular rhythm and normal heart sounds.   No murmur heard. Respiratory: Effort normal and breath sounds normal. No respiratory distress. She has no wheezes.  GI:  Soft. She exhibits no distension and no mass. There is no tenderness.  Genitourinary: Vagina normal.       Normal sized uterus No adnexal mass No significant cystocele or rectocele, fair kegel, mobile urethra    Results for orders placed during the hospital encounter of 01/24/12 (from the past 24 hour(s))  PREGNANCY, URINE     Status: Normal   Collection Time   01/24/12  6:00 AM      Component Value Range   Preg Test, Ur NEGATIVE  NEGATIVE    No results found.  Assessment/Plan: SUI.  Medical and surgical options discussed, she wants to proceed with sling.  The procedure, risks, chances of achieving success all discussed.  Different kinds of slings and risks associated with mesh also discussed.  Will admit and proceed with Solyx sling.    Rendon Howell D 01/24/2012, 7:02 AM

## 2012-01-24 NOTE — Anesthesia Preprocedure Evaluation (Signed)
Anesthesia Evaluation  Patient identified by MRN, date of birth, ID band Patient awake    Reviewed: Allergy & Precautions, H&P , Patient's Chart, lab work & pertinent test results, reviewed documented beta blocker date and time   History of Anesthesia Complications Negative for: history of anesthetic complications  Airway Mallampati: II TM Distance: >3 FB Neck ROM: full    Dental No notable dental hx.    Pulmonary neg pulmonary ROS,  breath sounds clear to auscultation  Pulmonary exam normal       Cardiovascular Exercise Tolerance: Good negative cardio ROS  Rhythm:regular Rate:Normal     Neuro/Psych PSYCHIATRIC DISORDERS negative neurological ROS  negative psych ROS   GI/Hepatic negative GI ROS, Neg liver ROS, GERD-  Controlled,  Endo/Other  negative endocrine ROS  Renal/GU negative Renal ROS     Musculoskeletal   Abdominal   Peds  Hematology negative hematology ROS (+)   Anesthesia Other Findings Hemorrhoids     Anxiety        Anal fistula      Reproductive/Obstetrics negative OB ROS                           Anesthesia Physical Anesthesia Plan  ASA: II  Anesthesia Plan: General LMA   Post-op Pain Management:    Induction:   Airway Management Planned:   Additional Equipment:   Intra-op Plan:   Post-operative Plan:   Informed Consent: I have reviewed the patients History and Physical, chart, labs and discussed the procedure including the risks, benefits and alternatives for the proposed anesthesia with the patient or authorized representative who has indicated his/her understanding and acceptance.   Dental Advisory Given  Plan Discussed with: CRNA, Surgeon and Anesthesiologist  Anesthesia Plan Comments:         Anesthesia Quick Evaluation

## 2012-01-24 NOTE — Op Note (Signed)
Preoperative diagnosis: Stress urinary incontinence Postoperative diagnosis: Stress urinary incontinence Procedure: Solyx sling Surgeon: Lavina Hamman M.D. Anesthesia: Gen. With an LMA Findings: She had a hypermobile urethra. With cystoscopy there was no injury to the bladder or urethra. Estimated blood loss: 100 cc Complications: None  Procedure in detail:  Patient was taken to the operating room and placed in the dorsosupine position. General anesthesia was induced and she was placed in mobile stirrups. Perineum, vagina and lower abdomen were then prepped and draped in usual sterile fashion and bladder drained with a red Robinson catheter. Legs were elevated about halfway in the stirrups. The anterior vagina was grasped with Allis clamps proximally 1 1/2 fingerbreadths from the urethral meatus. Local anesthetic with half percent Marcaine was infiltrated in the midline and bilaterally for hydrodissection. A 1 cm vertical incision was was then made in the vagina between the Allis clamps. The edges of the incision were then grasped with the Allis clamps. Metzenbaum scissors were used to sharply dissected the vaginal mucosa to each pubic ramus. The Solyx sling was then first placed on the patient's right side and anchored behind the pubic bone. It had to be removed once as it twisted and I wanted to be sure that it was not twisted. A good placement was achieved on the right side. Placement was achieved on the left side in a similar fashion submucosal to just past the pubic ramus. A right angle clamp was able to just barely be passed between the sling and the urethral tissue confirming good tension. Cystoscopy was performed which revealed a normal bladder and no evidence of injury to the bladder or the urethra. 200 cc of fluid was used for the cystoscopy. The cystoscope was removed. A Cred maneuver was performed and no leakage was seen. The Solyx sling was released on the left side. The vaginal incision  was then closed with running locking 2-0 Vicryl with adequate closure and adequate hemostasis. All instruments were then removed from the vagina. The patient tolerated the procedure well and was taken to the recovery room in stable condition. Counts were correct, she received clindamycin and Cipro prior to the procedure, she had PAS hose on throughout the procedure.

## 2012-01-24 NOTE — Transfer of Care (Signed)
Immediate Anesthesia Transfer of Care Note  Patient: Amber Blankenship  Procedure(s) Performed: Procedure(s) (LRB) with comments: TRANSVAGINAL TAPE (TVT) PROCEDURE (N/A) - Solyx sling, possible TVT  Patient Location: PACU  Anesthesia Type:General  Level of Consciousness: awake, oriented and patient cooperative  Airway & Oxygen Therapy: Patient Spontanous Breathing and Patient connected to nasal cannula oxygen  Post-op Assessment: Report given to PACU RN and Post -op Vital signs reviewed and stable  Post vital signs: Reviewed and stable  Complications: No apparent anesthesia complications

## 2012-01-25 ENCOUNTER — Encounter (HOSPITAL_COMMUNITY): Payer: Self-pay | Admitting: Obstetrics and Gynecology

## 2012-02-16 ENCOUNTER — Telehealth: Payer: Self-pay | Admitting: Internal Medicine

## 2012-02-16 MED ORDER — ESOMEPRAZOLE MAGNESIUM 40 MG PO CPDR: 40.0000 mg | DELAYED_RELEASE_CAPSULE | Freq: Every day | ORAL | Status: DC

## 2012-02-16 NOTE — Telephone Encounter (Signed)
Pt said she had called 4-5 days ago asking about getting her Nexium refilled. I do not see any documentation of this, but she was seen last April 2013 by KJ. Can she get a RF or some samples to hold her over? Please advise.

## 2012-02-16 NOTE — Telephone Encounter (Signed)
No documentation that she has been in touch with our office since 05/2011. She never had her EGD.  I will give her some nexium, but patient needs OV.

## 2012-02-25 ENCOUNTER — Encounter: Payer: Self-pay | Admitting: Internal Medicine

## 2012-02-25 NOTE — Telephone Encounter (Signed)
Pt's husband is aware that patient will need OV to further her RF and I also mailed letter for patient to call our office to set up OV

## 2014-02-23 ENCOUNTER — Telehealth: Payer: Self-pay | Admitting: Nurse Practitioner

## 2014-02-23 DIAGNOSIS — J019 Acute sinusitis, unspecified: Secondary | ICD-10-CM

## 2014-02-23 MED ORDER — AZITHROMYCIN 250 MG PO TABS: ORAL_TABLET | ORAL | Status: DC

## 2014-02-23 NOTE — Progress Notes (Signed)
We are sorry that you are not feeling well.  Here is how we plan to help!  Based on what you have shared with me it looks like you have sinusitis.  Sinusitis is inflammation and infection in the sinus cavities of the head.  Based on your presentation I believe you most likely have Acute Bacterial sinusitis.  This is an infection caused by bacteria and is treated with antibiotics.  I have prescribed z pac, which is an antibiotic in the macrolide family. You may use an oral decongestant such as Mucinex D or if you have glaucoma or high blood pressure use plain Mucinex.  Saline nasal sprays help an can sefely be used as often as needed for congestion.  If you develop worsening sinus pain, fever or notice severe headache and vision changes, or if symptoms are not better after completion of antibiotic, please schedule an appointment with a health care provider.  Sinus infections are not as easily transmitted as other respiratory infection, however we still recommend that you avoid close contact with loved ones, especially the very young and elderly.  Remember to wash your hands thoroughly throughout the day as this is the number one way to prevent the spread of infection!  Home Care:  Only take medications as instructed by your medical team.  Complete the entire course of an antibiotic.  Do not take these medications with alcohol.  A steam or ultrasonic humidifier can help congestion.  You can place a towel over your head and breathe in the steam from hot water coming from a faucet.  Avoid close contacts especially the very young and the elderly.  Cover your mouth when you cough or sneeze.  Always remember to wash your hands.  Get Help Right Away If:  You develop worsening fever or sinus pain.  You develop a severe head ache or visual changes.  Your symptoms persist after you have completed your treatment plan.  Make sure you  Understand these instructions.  Will watch your  condition.  Will get help right away if you are not doing well or get worse.  Your e-visit answers were reviewed by a board certified advanced clinical practitioner to complete your personal care plan.  Depending on the condition, your plan could have included both over the counter or prescription medications.  If there is a problem please reply  once you have received a response from your provider.  Your safety is important to [place].  If you have drug allergies check your prescription carefully.    You can use MyChart to ask questions about today's visit, request a non-urgent call back, or ask for a work or school excuse.  You will get an e-mail in the next two days asking about your experience.  I hope that your e-visit has been valuable and will speed your recovery. Thank you for using e-visits.

## 2014-09-06 ENCOUNTER — Encounter: Payer: Self-pay | Admitting: Podiatry

## 2014-09-06 ENCOUNTER — Ambulatory Visit (INDEPENDENT_AMBULATORY_CARE_PROVIDER_SITE_OTHER): Payer: BC Managed Care – PPO

## 2014-09-06 ENCOUNTER — Ambulatory Visit (INDEPENDENT_AMBULATORY_CARE_PROVIDER_SITE_OTHER): Payer: BC Managed Care – PPO | Admitting: Podiatry

## 2014-09-06 VITALS — BP 104/64 | HR 96 | Temp 99.2°F | Resp 14

## 2014-09-06 DIAGNOSIS — M779 Enthesopathy, unspecified: Secondary | ICD-10-CM | POA: Diagnosis not present

## 2014-09-06 DIAGNOSIS — M79671 Pain in right foot: Secondary | ICD-10-CM

## 2014-09-06 MED ORDER — TRIAMCINOLONE ACETONIDE 10 MG/ML IJ SUSP
10.0000 mg | Freq: Once | INTRAMUSCULAR | Status: AC
  Administered 2014-09-06: 10 mg

## 2014-09-06 NOTE — Progress Notes (Signed)
   Subjective:    Patient ID: Amber Blankenship, female    DOB: 12/23/1975, 39 y.o.   MRN: [id number]  HPI  Patient presents here today with right top of foot pain which started about 6 months ago.flip flops and being on feet aggitates the area. She uses a heating pad on the foot  and it sometimes helps.  Review of Systems  All other systems reviewed and are negative.      Objective:   Physical Exam        Assessment & Plan:

## 2014-09-07 NOTE — Progress Notes (Signed)
Subjective:     Patient ID: Amber Blankenship, female   DOB: December 02, 1975, 39 y.o.   MRN: [id number]  HPI patient presents stating I'm having a lot of pain on top of my right foot and I do not remember specific injury and it's been going on now for about 6 months   Review of Systems  All other systems reviewed and are negative.      Objective:   Physical Exam Neurovascular status found to be intact with muscle strength adequate range of motion within normal limits. Patient's found to have dorsal pain right nondescript in nature with inflammation around the fifth metatarsal with no other issues occurring. Minimal edema is noted and it appears to be strictly inflammatory    Assessment:     Tendinitis right foot with pain also around the base of the fifth metatarsal consistent with peroneal like tendinitis    Plan:     H&P and x-rays reviewed and today peroneal injection administered 3 mg Kenalog 5 mg Xylocaine along with ice therapy and fascial brace to immobilize the foot. Reappoint for [place] to recheck again in the next several weeks or earlier if any issues should occur

## 2014-09-12 ENCOUNTER — Ambulatory Visit: Payer: BC Managed Care – PPO | Admitting: Podiatry

## 2014-10-18 ENCOUNTER — Other Ambulatory Visit: Payer: Self-pay | Admitting: Obstetrics and Gynecology

## 2014-10-18 DIAGNOSIS — R928 Other abnormal and inconclusive findings on diagnostic imaging of breast: Secondary | ICD-10-CM

## 2014-10-21 ENCOUNTER — Ambulatory Visit
Admission: RE | Admit: 2014-10-21 | Discharge: 2014-10-21 | Disposition: A | Discharge status: Home / Self Care | Payer: BC Managed Care – PPO | Source: Ambulatory Visit | Attending: Obstetrics and Gynecology | Admitting: Obstetrics and Gynecology

## 2014-10-21 DIAGNOSIS — R928 Other abnormal and inconclusive findings on diagnostic imaging of breast: Secondary | ICD-10-CM

## 2015-07-02 ENCOUNTER — Other Ambulatory Visit: Payer: Self-pay | Admitting: Gynecology

## 2015-07-02 DIAGNOSIS — N644 Mastodynia: Secondary | ICD-10-CM

## 2015-07-04 ENCOUNTER — Other Ambulatory Visit: Payer: Self-pay | Admitting: Gynecology

## 2015-07-04 DIAGNOSIS — N6001 Solitary cyst of right breast: Secondary | ICD-10-CM

## 2015-07-10 ENCOUNTER — Ambulatory Visit
Admission: RE | Admit: 2015-07-10 | Discharge: 2015-07-10 | Disposition: A | Discharge status: Home / Self Care | Payer: BC Managed Care – PPO | Source: Ambulatory Visit | Attending: Gynecology | Admitting: Gynecology

## 2015-07-10 DIAGNOSIS — N6001 Solitary cyst of right breast: Secondary | ICD-10-CM

## 2016-04-12 ENCOUNTER — Encounter: Payer: Self-pay | Admitting: Podiatry

## 2016-04-12 ENCOUNTER — Ambulatory Visit (INDEPENDENT_AMBULATORY_CARE_PROVIDER_SITE_OTHER): Payer: BC Managed Care – PPO | Admitting: Podiatry

## 2016-04-12 DIAGNOSIS — M779 Enthesopathy, unspecified: Secondary | ICD-10-CM

## 2016-04-12 MED ORDER — TRIAMCINOLONE ACETONIDE 10 MG/ML IJ SUSP
10.0000 mg | Freq: Once | INTRAMUSCULAR | Status: AC
  Administered 2016-04-12: 10 mg

## 2016-04-14 NOTE — Progress Notes (Signed)
Subjective:     Patient ID: Amber Blankenship, female   DOB: [birth year], 41 y.o.   MRN: [id number]  HPI patient points the right foot stating it started to hurt on the outside again   Review of Systems     Objective:   Physical Exam Neurovascular status intact with inflammation of the lateral side right foot at the insertion peroneal tendon and slightly distal    Assessment:     Peroneal tendinitis right    Plan:     Sheath injection administered 3 mg Kenalog 5 mill grams Xylocaine advised on heat ice therapy and reappoint to recheck again in the next 4-6 weeks or earlier if needed

## 2016-07-23 ENCOUNTER — Ambulatory Visit: Payer: BC Managed Care – PPO | Admitting: Family Medicine

## 2017-04-06 ENCOUNTER — Emergency Department
Admission: EM | Admit: 2017-04-06 | Discharge: 2017-04-07 | Disposition: A | Discharge status: Home / Self Care | Payer: BC Managed Care – PPO | Attending: Emergency Medicine | Admitting: Emergency Medicine

## 2017-04-06 ENCOUNTER — Emergency Department: Payer: BC Managed Care – PPO

## 2017-04-06 ENCOUNTER — Encounter: Payer: Self-pay | Admitting: Emergency Medicine

## 2017-04-06 ENCOUNTER — Other Ambulatory Visit: Payer: Self-pay

## 2017-04-06 DIAGNOSIS — R52 Pain, unspecified: Secondary | ICD-10-CM

## 2017-04-06 DIAGNOSIS — R1013 Epigastric pain: Secondary | ICD-10-CM | POA: Diagnosis present

## 2017-04-06 DIAGNOSIS — K529 Noninfective gastroenteritis and colitis, unspecified: Secondary | ICD-10-CM | POA: Diagnosis not present

## 2017-04-06 DIAGNOSIS — Z79899 Other long term (current) drug therapy: Secondary | ICD-10-CM | POA: Insufficient documentation

## 2017-04-06 LAB — CBC
HCT: 42.8 % (ref 35.0–47.0)
Hemoglobin: 14.2 g/dL (ref 12.0–16.0)
MCH: 28.9 pg (ref 26.0–34.0)
MCHC: 33.3 g/dL (ref 32.0–36.0)
MCV: 87 fL (ref 80.0–100.0)
PLATELETS: 201 10*3/uL (ref 150–440)
RBC: 4.92 MIL/uL (ref 3.80–5.20)
RDW: 14.7 % — ABNORMAL HIGH (ref 11.5–14.5)
WBC: 12.9 10*3/uL — ABNORMAL HIGH (ref 3.6–11.0)

## 2017-04-06 LAB — URINALYSIS, COMPLETE (UACMP) WITH MICROSCOPIC
Bacteria, UA: NONE SEEN
Bilirubin Urine: NEGATIVE
GLUCOSE, UA: NEGATIVE mg/dL
Hgb urine dipstick: NEGATIVE
Ketones, ur: 5 mg/dL — AB
Leukocytes, UA: NEGATIVE
Nitrite: NEGATIVE
PH: 5 (ref 5.0–8.0)
Protein, ur: NEGATIVE mg/dL
RBC / HPF: NONE SEEN RBC/hpf (ref 0–5)
Specific Gravity, Urine: 1.009 (ref 1.005–1.030)
WBC, UA: NONE SEEN WBC/hpf (ref 0–5)

## 2017-04-06 LAB — COMPREHENSIVE METABOLIC PANEL
ALBUMIN: 4.2 g/dL (ref 3.5–5.0)
ALK PHOS: 64 U/L (ref 38–126)
ALT: 20 U/L (ref 14–54)
AST: 22 U/L (ref 15–41)
Anion gap: 10 (ref 5–15)
BUN: 12 mg/dL (ref 6–20)
CALCIUM: 8.8 mg/dL — AB (ref 8.9–10.3)
CO2: 20 mmol/L — AB (ref 22–32)
CREATININE: 0.78 mg/dL (ref 0.44–1.00)
Chloride: 107 mmol/L (ref 101–111)
GFR calc Af Amer: >60 mL/min (ref >60)
GFR calc non Af Amer: >60 mL/min (ref >60)
GLUCOSE: 90 mg/dL (ref 65–99)
Potassium: 4.3 mmol/L (ref 3.5–5.1)
SODIUM: 137 mmol/L (ref 135–145)
Total Bilirubin: 0.6 mg/dL (ref 0.3–1.2)
Total Protein: 7.2 g/dL (ref 6.5–8.1)

## 2017-04-06 LAB — POCT PREGNANCY, URINE: PREG TEST UR: NEGATIVE

## 2017-04-06 LAB — TROPONIN I: Troponin I: <0.03 ng/mL (ref <0.03)

## 2017-04-06 LAB — LIPASE, BLOOD: Lipase: 40 U/L (ref 11–51)

## 2017-04-06 MED ORDER — ONDANSETRON HCL 4 MG/2ML IJ SOLN
4.0000 mg | Freq: Once | INTRAMUSCULAR | Status: AC
  Administered 2017-04-06: 4 mg via INTRAVENOUS
  Filled 2017-04-06: qty 2

## 2017-04-06 MED ORDER — MORPHINE SULFATE (PF) 2 MG/ML IV SOLN
2.0000 mg | Freq: Once | INTRAVENOUS | Status: DC
  Filled 2017-04-06: qty 1

## 2017-04-06 MED ORDER — SODIUM CHLORIDE 0.9 % IV BOLUS (SEPSIS)
1000.0000 mL | Freq: Once | INTRAVENOUS | Status: AC
  Administered 2017-04-06: 1000 mL via INTRAVENOUS

## 2017-04-06 MED ORDER — GI COCKTAIL ~~LOC~~
30.0000 mL | Freq: Once | ORAL | Status: AC
  Administered 2017-04-06: 30 mL via ORAL
  Filled 2017-04-06: qty 30

## 2017-04-06 NOTE — ED Provider Notes (Signed)
[facility] Emergency Department Provider Note   First MD Initiated Contact with Patient 04/06/17 2320     (approximate)  I have reviewed the triage vital signs and the nursing notes.   HISTORY  Chief Complaint Abdominal Pain    HPI Amber Blankenship is a 42 y.o. female with below list of chronic medical conditions presents to the emergency department a 1 day history of epigastric abdominal pain accompanied by vomiting multiple episodes today that were nonbloody.  Patient denies any diarrhea.  Patient denies any fever.  Patient states that current pain score 6 out of 10 described as aching.  Patient denies any fever.  Patient denies any known sick contact   Past Medical History:  Diagnosis Date  . Anal fistula   . Anxiety   . Hemorrhoids     Patient Active Problem List   Diagnosis Date Noted  . Stress incontinence in female 01/24/2012  . Upper abdominal pain 06/15/2011  . GERD (gastroesophageal reflux disease) 06/15/2011  . Anal fissure 08/28/2010  . Hemorrhoids 08/28/2010    Past Surgical History:  Procedure Laterality Date  . ANAL FISSURE REPAIR  2012  . BLADDER SUSPENSION  01/24/2012   Procedure: TRANSVAGINAL TAPE (TVT) PROCEDURE;  Surgeon: Cheri Fowler, MD;  Location: [facility];  Service: Gynecology;  Laterality: N/A;  Solyx sling, possible TVT  . LASER ABLATION     uterine  . TUBAL LIGATION      Prior to Admission medications   Medication Sig Start Date End Date Taking? Authorizing Provider  esomeprazole (NEXIUM) 40 MG capsule Take 40 mg by mouth as needed.  05/31/14   [provider]  LORazepam (ATIVAN) 1 MG tablet Take 1 mg by mouth as needed.  08/21/14   [provider]  pantoprazole (PROTONIX) 40 MG tablet Take 1 tablet (40 mg total) by mouth daily. 04/07/17 05/07/17  Gregor Hams, MD  polycarbophil (FIBERCON) 625 MG tablet Take 625 mg by mouth daily.    [provider]  zolpidem (AMBIEN) 10 MG tablet Take 10  mg by mouth daily.  03/29/16   [provider]    Allergies Penicillins  No family history on file.  Social History Social History   Tobacco Use  . Smoking status: Never Smoker  . Smokeless tobacco: Never Used  Substance Use Topics  . Alcohol use: No    Alcohol/week: 0.0 oz  . Drug use: No    Review of Systems Constitutional: No fever/chills Eyes: No visual changes. ENT: No sore throat. Cardiovascular: Denies chest pain. Respiratory: Denies shortness of breath. Gastrointestinal: Positive abdominal pain and vomiting genitourinary: Negative for dysuria. Musculoskeletal: Negative for neck pain.  Negative for back pain. Integumentary: Negative for rash. Neurological: Negative for headaches, focal weakness or numbness.   ____________________________________________   PHYSICAL EXAM:  VITAL SIGNS: ED Triage Vitals [04/06/17 2123]  Enc Vitals Group     BP (!) 115/51     Pulse Rate 99     Resp      Temp 98 F (36.7 C)     Temp Source Oral     SpO2 100 %     Weight 93 kg (205 lb)     Height 1.626 m (5' 4" )     Head Circumference      Peak Flow      Pain Score 5     Pain Loc      Pain Edu?      Excl. in [place]?  Constitutional: Alert and oriented. Well appearing and in no acute distress. Eyes: Conjunctivae are normal.  Head: Atraumatic. Mouth/Throat: Mucous membranes are moist.  Oropharynx non-erythematous. Neck: No stridor.   Cardiovascular: Normal rate, regular rhythm. Good peripheral circulation. Grossly normal heart sounds. Respiratory: Normal respiratory effort.  No retractions. Lungs CTAB. Gastrointestinal: Epigastric/left upper quadrant tenderness to palpation.. No distention.  Musculoskeletal: No lower extremity tenderness nor edema. No gross deformities of extremities. Neurologic:  Normal speech and language. No gross focal neurologic deficits are appreciated.  Skin:  Skin is warm, dry and intact. No rash noted. Psychiatric: Mood and affect are  normal. Speech and behavior are normal.  ____________________________________________   LABS (all labs ordered are listed, but only abnormal results are displayed)  Labs Reviewed  COMPREHENSIVE METABOLIC PANEL - Abnormal; Notable for the following components:      Result Value   CO2 20 (*)    Calcium 8.8 (*)    All other components within normal limits  CBC - Abnormal; Notable for the following components:   WBC 12.9 (*)    RDW 14.7 (*)    All other components within normal limits  URINALYSIS, COMPLETE (UACMP) WITH MICROSCOPIC - Abnormal; Notable for the following components:   Color, Urine STRAW (*)    APPearance CLEAR (*)    Ketones, ur 5 (*)    Squamous Epithelial / LPF 0-5 (*)    All other components within normal limits  LIPASE, BLOOD  TROPONIN I  POC URINE PREG, ED  POCT PREGNANCY, URINE   ____________________________________________  EKG  ED ECG REPORT I, [place] N Ashauna Bertholf, the attending physician, personally viewed and interpreted this ECG.   Date: 04/07/2017  EKG Time: 9:39 PM  Rate: 98  Rhythm: Normal sinus rhythm  Axis: Normal  Intervals: Normal  ST&T Change: None ____________________________________  RADIOLOGY I, Grannis N Leylany Nored, personally viewed and evaluated these images (plain radiographs) as part of my medical decision making, as well as reviewing the written report by the radiologist.    Official radiology report(s): Ct Abdomen Pelvis W Contrast  Result Date: 04/07/2017 CLINICAL DATA:  Acute onset of mid abdominal pain and vomiting. EXAM: CT ABDOMEN AND PELVIS WITH CONTRAST TECHNIQUE: Multidetector CT imaging of the abdomen and pelvis was performed using the standard protocol following bolus administration of intravenous contrast. CONTRAST:  17m ISOVUE-300 IOPAMIDOL (ISOVUE-300) INJECTION 61% COMPARISON:  CT of the abdomen and pelvis performed 02/18/2008, and right upper quadrant ultrasound performed 04/06/2017 FINDINGS: Lower chest: Minimal  bibasilar atelectasis is noted. The visualized portions of the mediastinum are unremarkable. Hepatobiliary: The liver is unremarkable in appearance. The gallbladder is unremarkable in appearance. The common bile duct remains normal in caliber. Pancreas: The pancreas is within normal limits. Spleen: The spleen is unremarkable in appearance. Adrenals/Urinary Tract: The adrenal glands are unremarkable in appearance. The kidneys are within normal limits. There is no evidence of hydronephrosis. No renal or ureteral stones are identified. No perinephric stranding is seen. Stomach/Bowel: The stomach is unremarkable in appearance. The small bowel is within normal limits. The appendix is normal in caliber, without evidence of appendicitis. The colon is unremarkable in appearance. Vascular/Lymphatic: The abdominal aorta is unremarkable in appearance. The inferior vena cava is grossly unremarkable. No retroperitoneal lymphadenopathy is seen. No pelvic sidewall lymphadenopathy is identified. Reproductive: The bladder is mildly distended and within normal limits. The uterus is grossly unremarkable in appearance. A tampon is noted at the vagina. The ovaries are relatively symmetric. No suspicious adnexal masses are seen. Other: No  additional soft tissue abnormalities are seen. Musculoskeletal: No acute osseous abnormalities are identified. The visualized musculature is unremarkable in appearance. IMPRESSION: No acute abnormality seen within the abdomen or pelvis. Electronically Signed   By: Garald Balding M.D.   On: 04/07/2017 00:36   US Abdomen Limited Ruq  Result Date: 04/06/2017 CLINICAL DATA:  Pain. EXAM: ULTRASOUND ABDOMEN LIMITED RIGHT UPPER QUADRANT COMPARISON:  None. FINDINGS: Gallbladder: No gallstones or wall thickening visualized. No sonographic Murphy sign noted by sonographer. Common bile duct: Diameter: 2.1 mm Liver: Mild increased echogenicity with no focal mass. Portal vein is patent on color Doppler imaging  with normal direction of blood flow towards the liver. IMPRESSION: Probable hepatic steatosis. The gallbladder is normal in appearance. Electronically Signed   By: Dorise Bullion III M.D   On: 04/06/2017 23:28     Procedures   ____________________________________________   INITIAL IMPRESSION / ASSESSMENT AND PLAN / ED COURSE  As part of my medical decision making, I reviewed the following data within the electronic MEDICAL RECORD [id number] year old female present with above-stated history and physical exam are to abdominal pain and vomiting.  Concern for possible gallbladder disease versus colitis versus potential other intra-abdominal pathology.  CT scan of the abdomen performed which revealed no acute intra-abdominal pathology.  Possibly of gastroenteritis.  Patient given Zofran and Protonix will be referred to GI if symptoms persist ____________________________________________  FINAL CLINICAL IMPRESSION(S) / ED DIAGNOSES  Final diagnoses:  Pain  Epigastric pain  Gastroenteritis     MEDICATIONS GIVEN DURING THIS VISIT:  Medications  morphine 2 MG/ML injection 2 mg (2 mg Intravenous Refused 04/07/17 0047)  pantoprazole (PROTONIX) EC tablet 40 mg (not administered)  ondansetron (ZOFRAN) injection 4 mg (4 mg Intravenous Given 04/06/17 2349)  gi cocktail (Maalox,Lidocaine,Donnatal) (30 mLs Oral Given 04/06/17 2349)  sodium chloride 0.9 % bolus 1,000 mL (0 mLs Intravenous Stopped 04/07/17 0150)  iopamidol (ISOVUE-300) 61 % injection 100 mL (100 mLs Intravenous Contrast Given 04/07/17 0007)     ED Discharge Orders        Ordered    pantoprazole (PROTONIX) 40 MG tablet  Daily     04/07/17 0206       Note:  This document was prepared using Dragon voice recognition software and may include unintentional dictation errors.    Gregor Hams, MD 04/07/17 [phone]

## 2017-04-06 NOTE — ED Triage Notes (Signed)
Patient ambulatory to triage with steady gait, without difficulty or distress noted; pt reports N/V and mid upper abd pain today

## 2017-04-06 NOTE — ED Notes (Signed)
Pt reports that she started vomiting this morning and since this has developed a pain in her mid abd area that radiates to bilat sides of abd - the pt states that the pain is constant and aching - she denies diarrhea - pt is aware that urine sample is needed

## 2017-04-07 ENCOUNTER — Emergency Department: Payer: BC Managed Care – PPO

## 2017-04-07 MED ORDER — IOPAMIDOL (ISOVUE-300) INJECTION 61%
100.0000 mL | Freq: Once | INTRAVENOUS | Status: AC | PRN
  Administered 2017-04-07: 100 mL via INTRAVENOUS

## 2017-04-07 MED ORDER — PANTOPRAZOLE SODIUM 40 MG PO TBEC: 40.0000 mg | DELAYED_RELEASE_TABLET | Freq: Every day | ORAL | 0 refills | Status: DC

## 2017-04-07 MED ORDER — PANTOPRAZOLE SODIUM 40 MG PO TBEC
40.0000 mg | DELAYED_RELEASE_TABLET | Freq: Once | ORAL | Status: AC
  Administered 2017-04-07: 40 mg via ORAL
  Filled 2017-04-07: qty 1

## 2017-04-07 MED ORDER — ONDANSETRON 4 MG PO TBDP: 4.0000 mg | ORAL_TABLET | Freq: Three times a day (TID) | ORAL | 0 refills | Status: AC | PRN

## 2017-04-07 NOTE — ED Notes (Signed)
Pt returned from CT °

## 2017-04-07 NOTE — ED Notes (Signed)
Patient transported to CT 

## 2017-04-07 NOTE — ED Notes (Signed)
Pt updated that Dr. Owens Shark would be in soon. Pt in NAD and stating that she has no needs at this time.

## 2017-04-08 ENCOUNTER — Encounter: Payer: Self-pay | Admitting: Nurse Practitioner

## 2017-04-22 ENCOUNTER — Encounter: Payer: Self-pay | Admitting: Nurse Practitioner

## 2017-04-22 ENCOUNTER — Ambulatory Visit: Payer: BC Managed Care – PPO | Admitting: Nurse Practitioner

## 2017-04-22 ENCOUNTER — Encounter: Payer: Self-pay | Admitting: Gastroenterology

## 2017-04-22 VITALS — BP 110/80 | HR 76 | Ht 63.5 in | Wt 206.2 lb

## 2017-04-22 DIAGNOSIS — R1013 Epigastric pain: Secondary | ICD-10-CM

## 2017-04-22 NOTE — Patient Instructions (Signed)
If you are age 43 or older, your body mass index should be between 23-30. Your Body mass index is 35.96 kg/m. If this is out of the aforementioned range listed, please consider follow up with your Primary Care Provider.  If you are age 6 or younger, your body mass index should be between 19-25. Your Body mass index is 35.96 kg/m. If this is out of the aformentioned range listed, please consider follow up with your Primary Care Provider.   You have been scheduled for an endoscopy. Please follow written instructions given to you at your visit today. If you use inhalers (even only as needed), please bring them with you on the day of your procedure. Your physician has requested that you go to www.startemmi.com and enter the access code given to you at your visit today. This web site gives a general overview about your procedure. However, you should still follow specific instructions given to you by our office regarding your preparation for the procedure.  Thank you for choosing me and [facility].   Tye Savoy, NP

## 2017-04-22 NOTE — Progress Notes (Signed)
Chief Complaint: Abdominal pain  pain  Referring Provider:     Self-referred   ASSESSMENT AND PLAN;    24.  42 year old female with intermittent nausea and vomiting for 2 weeks, now resolved.  Vomiting associated with nonradiating epigastric pain.  The pain has persisted despite resolution of nausea and vomiting a week ago.  Labs, ultrasound and CT scan in the ED remarkable -Continue PPI -Further evaluation patient will be scheduled for EGD. The risks and benefits of EGD were discussed and the patient agrees to proceed.   2. Obesity / Steatosis, normal LFTs.    HPI:    Patient is a 42 year old female, new to this practice here for evaluation of abdominal pain.  She started vomiting around 03/29/17. No significant associated diarrhea. Then she developed associated epigastric pain around 04/06/17. Vomiting resolved last Friday but still having dull, constant epigastric pain. Pain doesn't radiate. Anxiety makes the pain worse, eating doesn't affect it. No NSAID use. No hx of PUD. No weight loss. No black stools. Denies fevers. No skin rashes or joint ashes. Hx of GERD, takes daily PPI with good control of symptoms.  No chest pain, no shortness of breath or cough.  Of note, her described GI symptoms all followed on the heels of a respiratory infection.    Past Medical History:  Diagnosis Date  . Anal fistula anal fissure  . Anxiety   . Hemorrhoids     Past Surgical History:  Procedure Laterality Date  . ANAL FISSURE REPAIR  2012  . BLADDER SUSPENSION  01/24/2012   Procedure: TRANSVAGINAL TAPE (TVT) PROCEDURE;  Surgeon: Cheri Fowler, MD;  Location: [facility];  Service: Gynecology;  Laterality: N/A;  Solyx sling, possible TVT  . LASER ABLATION     uterine  . TUBAL LIGATION     Family History  Problem Relation Age of Onset  . Other Mother        not a stroke but something close to it  . Celiac disease Sister   . Breast cancer Maternal Grandmother   . Diabetes Maternal  Grandmother   . Celiac disease Other        niece  . Cystic fibrosis Other        niece  . Diabetes Maternal Aunt    Social History   Tobacco Use  . Smoking status: Current Some Day Smoker  . Smokeless tobacco: Never Used  . Tobacco comment: only when gets really stressed   Substance Use Topics  . Alcohol use: No    Alcohol/week: 0.0 oz  . Drug use: No   Current Outpatient Medications  Medication Sig Dispense Refill  . LORazepam (ATIVAN) 1 MG tablet Take 1 mg by mouth as needed.   2  . ondansetron (ZOFRAN ODT) 4 MG disintegrating tablet Take 1 tablet (4 mg total) by mouth every 8 (eight) hours as needed for nausea or vomiting. 20 tablet 0  . pantoprazole (PROTONIX) 40 MG tablet Take 1 tablet (40 mg total) by mouth daily. 30 tablet 0  . polycarbophil (FIBERCON) 625 MG tablet Take 625 mg by mouth daily.    Marland Kitchen zolpidem (AMBIEN) 10 MG tablet Take 10 mg by mouth daily.      No current facility-administered medications for this visit.    No Active Allergies   Review of Systems: All systems reviewed and negative except where noted in HPI.    Physical Exam:    BP 110/80 (BP Location: Left Arm, Patient Position: Sitting, Cuff Size:  Normal)   Pulse 76   Ht 5' 3.5" (1.613 m) Comment: height measured without shoes  Wt 206 lb 4 oz (93.6 kg)   BMI 35.96 kg/m  Constitutional: Pleasant obese white female in no acute distress. Psychiatric: Normal mood and affect. Behavior is normal. EENT: Pupils normal.  Conjunctivae are normal. No scleral icterus. Neck supple.  Cardiovascular: Normal rate, regular rhythm. No edema Pulmonary/chest: Effort normal and breath sounds normal. No wheezing, rales or rhonchi. Abdominal: Soft, nondistended. Mild epigastric tenderness.  Bowel sounds active throughout. There are no masses palpable. No hepatomegaly. Neurological: Alert and oriented to person place and time. Skin: Skin is warm and dry. No rashes noted.  Tye Savoy, NP  04/22/2017, 9:05  AM

## 2017-04-22 NOTE — Progress Notes (Signed)
Agree with assessment and plan as outlined. Imaging is reassuring. Will await EGD result.

## 2017-04-29 ENCOUNTER — Ambulatory Visit (AMBULATORY_SURGERY_CENTER): Payer: BC Managed Care – PPO | Admitting: Gastroenterology

## 2017-04-29 ENCOUNTER — Encounter: Payer: Self-pay | Admitting: Gastroenterology

## 2017-04-29 ENCOUNTER — Other Ambulatory Visit: Payer: Self-pay

## 2017-04-29 VITALS — BP 125/75 | HR 64 | Temp 98.0°F | Resp 15 | Ht 63.0 in | Wt 206.0 lb

## 2017-04-29 DIAGNOSIS — R1013 Epigastric pain: Secondary | ICD-10-CM

## 2017-04-29 MED ORDER — SODIUM CHLORIDE 0.9 % IV SOLN: 500.0000 mL | Freq: Once | INTRAVENOUS | Status: AC

## 2017-04-29 NOTE — Progress Notes (Signed)
To recovery, report tpo RN, VSS

## 2017-04-29 NOTE — Op Note (Signed)
[place] Patient Name: Amber Blankenship Procedure Date: 04/29/2017 11:45 AM MRN: [id number] Endoscopist: Remo Lipps P. Armbruster MD, MD Age: 42 Referring MD:  Date of Birth: [birth year] Gender: Female Account #: [id number] Procedure:                Upper GI endoscopy Indications:              Epigastric abdominal pain Medicines:                Monitored Anesthesia Care Procedure:                Pre-Anesthesia Assessment:                           - Prior to the procedure, a History and Physical                            was performed, and patient medications and                            allergies were reviewed. The patient's tolerance of                            previous anesthesia was also reviewed. The risks                            and benefits of the procedure and the sedation                            options and risks were discussed with the patient.                            All questions were answered, and informed consent                            was obtained. Prior Anticoagulants: The patient has                            taken no previous anticoagulant or antiplatelet                            agents. ASA Grade Assessment: II - A patient with                            mild systemic disease. After reviewing the risks                            and benefits, the patient was deemed in                            satisfactory condition to undergo the procedure.                           After obtaining informed consent, the endoscope was  passed under direct vision. Throughout the                            procedure, the patient's blood pressure, pulse, and                            oxygen saturations were monitored continuously. The                            Endoscope was introduced through the mouth, and                            advanced to the second part of duodenum. The upper                            GI endoscopy was  accomplished without difficulty.                            The patient tolerated the procedure well. Scope In: Scope Out: Findings:                 Esophagogastric landmarks were identified: the                            Z-line was found at 37 cm, the gastroesophageal                            junction was found at 37 cm and the upper extent of                            the gastric folds was found at 38 cm from the                            incisors.                           A 1 cm hiatal hernia was present.                           The exam of the esophagus was otherwise normal.                           The entire examined stomach was normal. Biopsies                            were taken with a cold forceps from the antrum,                            body, and incisura for Helicobacter pylori testing.                           The duodenal bulb and second portion of the  duodenum were normal. Complications:            No immediate complications. Estimated blood loss:                            Minimal. Estimated Blood Loss:     Estimated blood loss was minimal. Impression:               - Esophagogastric landmarks identified.                           - 1 cm hiatal hernia.                           - Normal esophagus.                           - Normal stomach. Biopsied to rule out H pylori                           - Normal duodenal bulb and second portion of the                            duodenum. Recommendation:           - Patient has a contact number available for                            emergencies. The signs and symptoms of potential                            delayed complications were discussed with the                            patient. Return to normal activities tomorrow.                            Written discharge instructions were provided to the                            patient.                           - Resume previous  diet.                           - Continue present medications.                           - Await pathology results. Remo Lipps P. Armbruster MD, MD 04/29/2017 12:04:13 PM This report has been signed electronically.

## 2017-04-29 NOTE — Progress Notes (Signed)
Called to room to assist during endoscopic procedure.  Patient ID and intended procedure confirmed with present staff. Received instructions for my participation in the procedure from the performing physician.  

## 2017-04-29 NOTE — Patient Instructions (Signed)
YOU HAD AN ENDOSCOPIC PROCEDURE TODAY AT [facility]:   Refer to the procedure report that was given to you for any specific questions about what was found during the examination.  If the procedure report does not answer your questions, please call your gastroenterologist to clarify.  If you requested that your care partner not be given the details of your procedure findings, then the procedure report has been included in a sealed envelope for you to review at your convenience later.  YOU SHOULD EXPECT: Some feelings of bloating in the abdomen. Passage of more gas than usual.  Walking can help get rid of the air that was put into your GI tract during the procedure and reduce the bloating. If you had a lower endoscopy (such as a colonoscopy or flexible sigmoidoscopy) you may notice spotting of blood in your stool or on the toilet paper. If you underwent a bowel prep for your procedure, you may not have a normal bowel movement for a few days.  Please Note:  You might notice some irritation and congestion in your nose or some drainage.  This is from the oxygen used during your procedure.  There is no need for concern and it should clear up in a day or so.  SYMPTOMS TO REPORT IMMEDIATELY:    Following upper endoscopy (EGD)  Vomiting of blood or coffee ground material  New chest pain or pain under the shoulder blades  Painful or persistently difficult swallowing  New shortness of breath  Fever of 100F or higher  Black, tarry-looking stools  For urgent or emergent issues, a gastroenterologist can be reached at any hour by calling [phone].   DIET:  We do recommend a small meal at first, but then you may proceed to your regular diet.  Drink plenty of fluids but you should avoid alcoholic beverages for 24 hours.  ACTIVITY:  You should plan to take it easy for the rest of today and you should NOT DRIVE or use heavy machinery until tomorrow (because of the sedation medicines used  during the test).    FOLLOW UP: Our staff will call the number listed on your records the next business day following your procedure to check on you and address any questions or concerns that you may have regarding the information given to you following your procedure. If we do not reach you, we will leave a message.  However, if you are feeling well and you are not experiencing any problems, there is no need to return our call.  We will assume that you have returned to your regular daily activities without incident.  If any biopsies were taken you will be contacted by phone or by letter within the next 1-3 weeks.  Please call us at [phone] if you have not heard about the biopsies in 3 weeks.    SIGNATURES/CONFIDENTIALITY: You and/or your care partner have signed paperwork which will be entered into your electronic medical record.  These signatures attest to the fact that that the information above on your After Visit Summary has been reviewed and is understood.  Full responsibility of the confidentiality of this discharge information lies with you and/or your care-partner.    Handouts were given to your care partner on a hiatal hernia. You may resume your current medications today. Await biopsy results. Please call if any questions or concerns.

## 2017-04-29 NOTE — Progress Notes (Signed)
No problems noted in the recovery room. maw 

## 2017-05-02 ENCOUNTER — Telehealth: Payer: Self-pay

## 2017-05-02 NOTE — Telephone Encounter (Signed)
  Follow up Call-  Call back number 04/29/2017  Post procedure Call Back phone  # [phone]  Permission to leave phone message Yes  Some recent data might be hidden     Patient questions:  Do you have a fever, pain , or abdominal swelling? No. Pain Score  0 *  Have you tolerated food without any problems? Yes.    Have you been able to return to your normal activities? Yes.    Do you have any questions about your discharge instructions: Diet   No. Medications  No. Follow up visit  No.  Do you have questions or concerns about your Care? No.  Actions: * If pain score is 4 or above: No action needed, pain <4.

## 2017-05-13 ENCOUNTER — Encounter: Payer: Self-pay | Admitting: Gastroenterology

## 2017-05-13 ENCOUNTER — Other Ambulatory Visit (INDEPENDENT_AMBULATORY_CARE_PROVIDER_SITE_OTHER): Payer: BC Managed Care – PPO

## 2017-05-13 ENCOUNTER — Ambulatory Visit: Payer: BC Managed Care – PPO | Admitting: Gastroenterology

## 2017-05-13 VITALS — BP 100/70 | HR 88 | Ht 63.5 in | Wt 210.1 lb

## 2017-05-13 DIAGNOSIS — K760 Fatty (change of) liver, not elsewhere classified: Secondary | ICD-10-CM | POA: Diagnosis not present

## 2017-05-13 DIAGNOSIS — R109 Unspecified abdominal pain: Secondary | ICD-10-CM | POA: Diagnosis not present

## 2017-05-13 LAB — CBC WITH DIFFERENTIAL/PLATELET
BASOS PCT: 1 % (ref 0.0–3.0)
Basophils Absolute: 0.1 10*3/uL (ref 0.0–0.1)
Eosinophils Absolute: 0.1 10*3/uL (ref 0.0–0.7)
Eosinophils Relative: 1.5 % (ref 0.0–5.0)
HEMATOCRIT: 40.6 % (ref 36.0–46.0)
HEMOGLOBIN: 13.8 g/dL (ref 12.0–15.0)
LYMPHS PCT: 24.5 % (ref 12.0–46.0)
Lymphs Abs: 1.8 10*3/uL (ref 0.7–4.0)
MCHC: 33.8 g/dL (ref 30.0–36.0)
MCV: 86.9 fl (ref 78.0–100.0)
MONOS PCT: 4.8 % (ref 3.0–12.0)
Monocytes Absolute: 0.4 10*3/uL (ref 0.1–1.0)
NEUTROS ABS: 5 10*3/uL (ref 1.4–7.7)
Neutrophils Relative %: 68.2 % (ref 43.0–77.0)
PLATELETS: 248 10*3/uL (ref 150.0–400.0)
RBC: 4.68 Mil/uL (ref 3.87–5.11)
RDW: 14.4 % (ref 11.5–15.5)
WBC: 7.4 10*3/uL (ref 4.0–10.5)

## 2017-05-13 MED ORDER — DICYCLOMINE HCL 10 MG PO CAPS: 10.0000 mg | ORAL_CAPSULE | Freq: Three times a day (TID) | ORAL | 1 refills | Status: DC | PRN

## 2017-05-13 NOTE — Patient Instructions (Signed)
If you are age 43 or older, your body mass index should be between 23-30. Your Body mass index is 36.64 kg/m. If this is out of the aforementioned range listed, please consider follow up with your Primary Care Provider.  If you are age 79 or younger, your body mass index should be between 19-25. Your Body mass index is 36.64 kg/m. If this is out of the aformentioned range listed, please consider follow up with your Primary Care Provider.   Please go to the lab in the basement of our building to have lab work done as you leave today.  We have sent the following medications to your pharmacy for you to pick up at your convenience: Bentyl 71m: Take every 8 hours as needed  Thank you for entrusting me with your care and [facility] SCarolina Cellar

## 2017-05-13 NOTE — Progress Notes (Signed)
HPI :  42 year old female here for follow-up visit.  Is my first time seeing her in clinic.  She has been having abdominal pain in the epigastric to mid abdomen for the past 6-8 weeks.  She states the pain comes and goes, it is sporadic.  Last about 20 minutes at a time and goes away.  She says she has it about 2-3 times every day.  It is rated 2-3 out of 10.  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  She denies any heartburn or reflux symptoms.  The pain does not radiate anywhere.  Does not go to the back of the shoulder.  She does not think eating makes this worse at all.  She does not think having a bowel movement will change it at all.  Her bowels are stable with regularity.  She takes a daily fiber supplement and stool softener even her history of anal fissure.  She has some occasional nausea but no vomiting.  No gas or bloating.  She is not on any new medications.  She had a trial of Protonix which did not help and she stopped it.  She does not drink any alcohol.  She was previously noted to have fatty liver on ultrasound.  Her gallbladder was evaluated and normal.  CT scan of her abdomen did not show any clear etiology.  She had an EGD on March 1 which was normal.  On review of her chart she had a remote HIDA scan in the past which was also normal.  She did have a CBC showing a mild leukocytosis of 12 when in the ER a few weeks ago.  Prior workup: CT scan 04/07/2017 - no acute abnormality [place] 04/06/2017 - steatosis, normal GB EGD 04/29/2017 - 1cm HH, normal stomach / duodenum - negative for H pylori HIDA 2013 normal   Past Medical History:  Diagnosis Date  . Allergy   . Anal fistula anal fissure  . Anxiety   . Hemorrhoids      Past Surgical History:  Procedure Laterality Date  . ANAL FISSURE REPAIR  2012  . BLADDER SUSPENSION  01/24/2012   Procedure: TRANSVAGINAL TAPE (TVT) PROCEDURE;  Surgeon: Cheri Fowler, MD;  Location: [facility];  Service:  Gynecology;  Laterality: N/A;  Solyx sling, possible TVT  . LASER ABLATION     uterine  . TUBAL LIGATION     Family History  Problem Relation Age of Onset  . Other Mother        not a stroke but something close to it  . Celiac disease Sister   . Breast cancer Maternal Grandmother   . Diabetes Maternal Grandmother   . Celiac disease Other        niece  . Cystic fibrosis Other        niece  . Diabetes Maternal Aunt   . Colon cancer Daughter   . Esophageal cancer Daughter    Social History   Tobacco Use  . Smoking status: Current Some Day Smoker  . Smokeless tobacco: Never Used  . Tobacco comment: only when gets really stressed   Substance Use Topics  . Alcohol use: No    Alcohol/week: 0.0 oz  . Drug use: No   Current Outpatient Medications  Medication Sig Dispense Refill  . docusate sodium (COLACE) 100 MG capsule Take 200 mg by mouth at bedtime.    Marland Kitchen LORazepam (ATIVAN) 1 MG tablet Take 1 mg by  mouth as needed.   2  . ondansetron (ZOFRAN ODT) 4 MG disintegrating tablet Take 1 tablet (4 mg total) by mouth every 8 (eight) hours as needed for nausea or vomiting. 20 tablet 0  . polycarbophil (FIBERCON) 625 MG tablet Take 625 mg by mouth daily.    Marland Kitchen zolpidem (AMBIEN) 10 MG tablet Take 10 mg by mouth daily.      Current Facility-Administered Medications  Medication Dose Route Frequency Provider Last Rate Last Dose  . 0.9 %  sodium chloride infusion  500 mL Intravenous Once Alley Neils, Carlota Raspberry, MD       No Known Allergies   Review of Systems: All systems reviewed and negative except where noted in HPI.   Lab Results  Component Value Date   ALT 20 04/06/2017   AST 22 04/06/2017   ALKPHOS 64 04/06/2017   BILITOT 0.6 04/06/2017    Lab Results  Component Value Date   CREATININE 0.78 04/06/2017   BUN 12 04/06/2017   NA 137 04/06/2017   K 4.3 04/06/2017   CL 107 04/06/2017   CO2 20 (L) 04/06/2017    Lab Results  Component Value Date   WBC 7.4 05/13/2017   HGB  13.8 05/13/2017   HCT 40.6 05/13/2017   MCV 86.9 05/13/2017   PLT 248.0 05/13/2017     Physical Exam: BP 100/70 (BP Location: Left Arm, Patient Position: Sitting, Cuff Size: Normal)   Pulse 88   Ht 5' 3.5" (1.613 m)   Wt 210 lb 2 oz (95.3 kg)   BMI 36.64 kg/m  Constitutional: Pleasant,well-developed, female in no acute distress. HEENT: Normocephalic and atraumatic. Conjunctivae are normal. No scleral icterus. Neck supple.  Cardiovascular: Normal rate, regular rhythm.  Pulmonary/chest: Effort normal and breath sounds normal. No wheezing, rales or rhonchi. Abdominal: Soft, nondistended, nontender. Could not reproduce pain, negative Carnett sign There are no masses palpable. No hepatomegaly. Extremities: no edema Lymphadenopathy: No cervical adenopathy noted. Neurological: Alert and oriented to person place and time. Skin: Skin is warm and dry. No rashes noted. Psychiatric: Normal mood and affect. Behavior is normal.   ASSESSMENT AND PLAN: 42 year old female here for reassessment of the following issues:  Abdominal pain - as described above, just superior to the umbilicus to epigastric area - negative ultrasound, CT scan, upper endoscopy.  There is no relation of her symptoms to eating or bowel movements.  I could not reproduce it on physical exam today.  She does endorse stress makes her pain worse.  Possible this could be functional pain or related to bowel spasm.  Musculoskeletal also possible although I think this less likely given I cannot reproduce it on her exam.  I reassured her given her workup to date I do not see anything concerning.  This does not sound biliary colic at all.  We spent some time discussing potential causes and how to move forward with this.  She was concerned about her prior white blood cell count being elevated, we will repeat that today to make sure it is okay.  Otherwise we will give her a trial of Bentyl 10 mg every 8 hours as needed to see if this helps at  all.  If her pain worsens over time or fails to improve we may consider a repeat CT scan.  She agreed with the plan will call me in a few weeks if she is not feeling better.  Fatty liver - I discussed the spectrum of fatty liver disease with her.  Her LFTs  are normal.  She does not drink alcohol.  Recommend weight loss for this issue and will need to monitor LFTs over time, check yearly.  Long-term she is at risk for [facility] and cirrhosis.  She agreed with plan and verbalized understanding  [place] Cellar, MD [facility] Pager [phone]

## 2017-05-24 ENCOUNTER — Telehealth: Payer: Self-pay | Admitting: Gastroenterology

## 2017-05-24 MED ORDER — OMEPRAZOLE 20 MG PO CPDR: DELAYED_RELEASE_CAPSULE | ORAL | 3 refills | Status: DC

## 2017-05-24 NOTE — Telephone Encounter (Signed)
Pt requesting if Dr. Havery Moros can prescribe something for acid reflux.

## 2017-05-24 NOTE — Telephone Encounter (Signed)
Called pt. She is having heartburn and burning; onset a few days. Was recently taken off of Protonix 40 qd but now is having symptoms.  Also, she wanted you to know she has tried bentyl a few times and it is helping. Please advise what she can try for the reflux.

## 2017-05-24 NOTE — Telephone Encounter (Signed)
Called and LM for patient.  Let her know I will send in a Rx for omeprazole 41m, once to twice a day.  If she would like to go back to pantoprazole to just let me know but would be good to try for 30 days. [facility] [address]

## 2017-05-24 NOTE — Telephone Encounter (Signed)
Thanks Jan. If protonix worked to control the heartburn, we can put her back on that at 1m / day. If she hasn't tried omeprazole can otherwise give her that if it's cheaper, also at 234m/ day. She can take either of these once to twice daily. Hope this sounds okay to her, if not, let me know we can try something else. You can give her a 90 day supply with 3 refills.

## 2018-01-13 ENCOUNTER — Other Ambulatory Visit: Payer: Self-pay | Admitting: Gastroenterology

## 2018-03-07 IMAGING — US US ABDOMEN LIMITED
1 series · 14 of 25 positions shown · non-contrast
Comparison: None.

CLINICAL DATA: Pain.

EXAM:
ULTRASOUND ABDOMEN LIMITED RIGHT UPPER QUADRANT

[Series 1: us abdomen limited · 0.25mm/px · 14 of 39 slices shown]
[im 1/39]
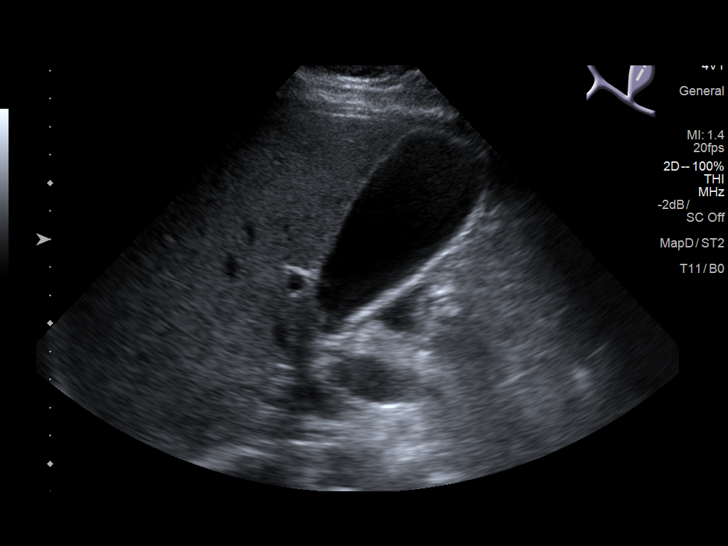
[im 4/39]
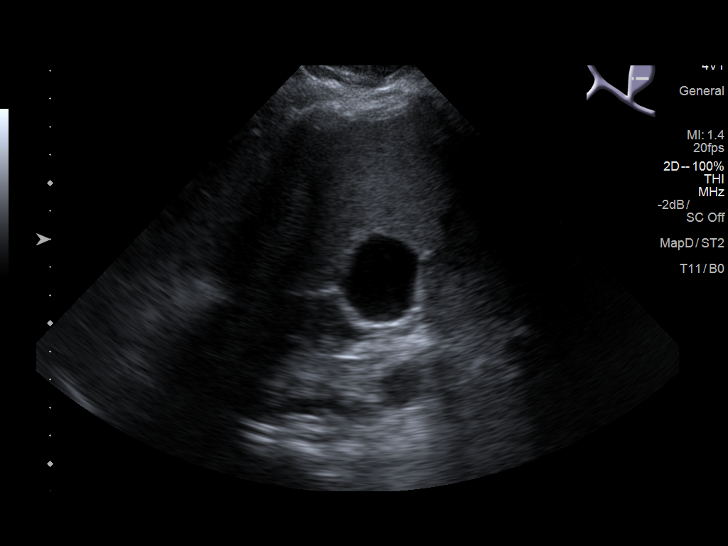
[im 7/39]
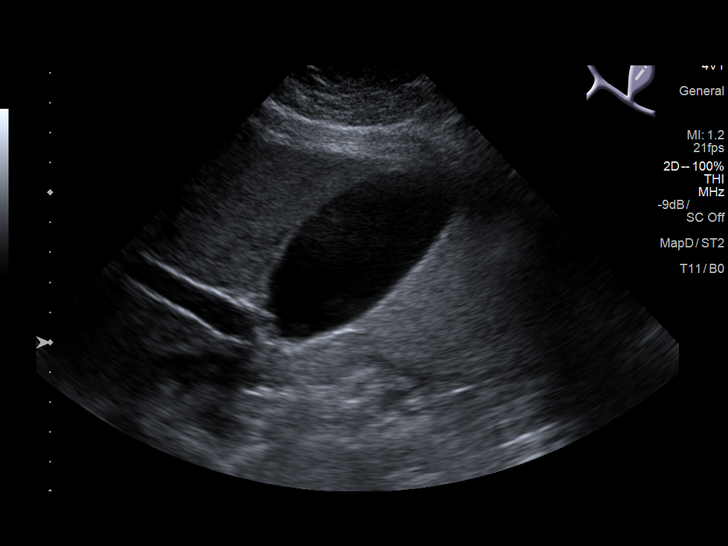
[im 10/39]
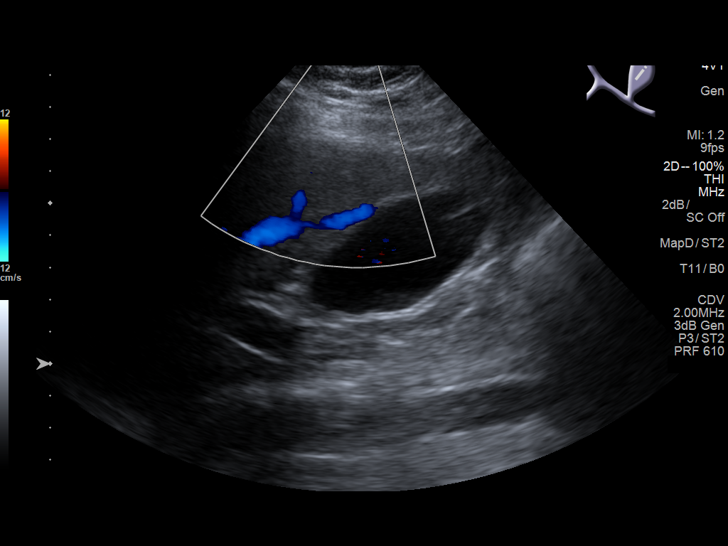
[im 13/39]
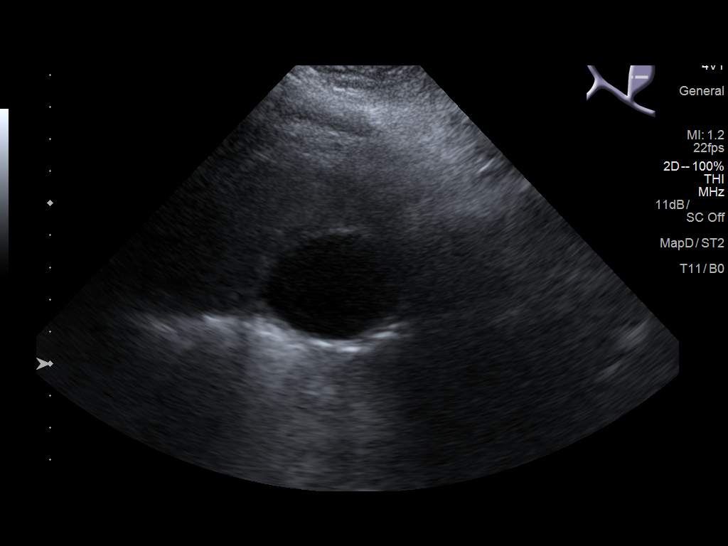
[im 15/39]
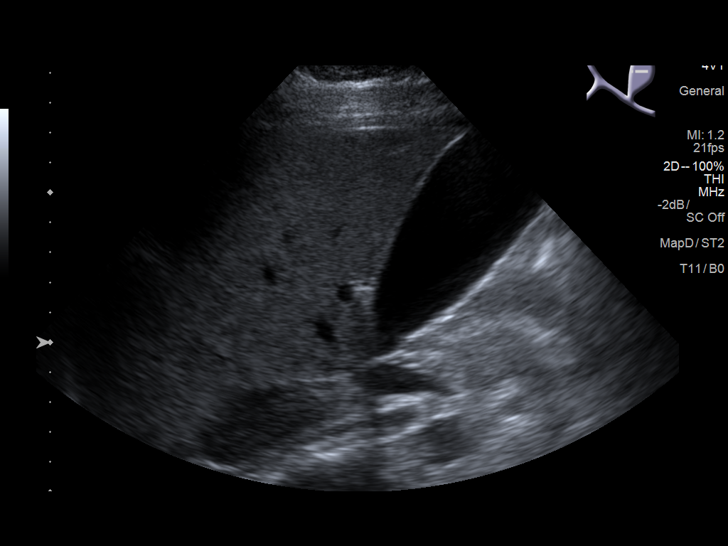
[im 18/39]
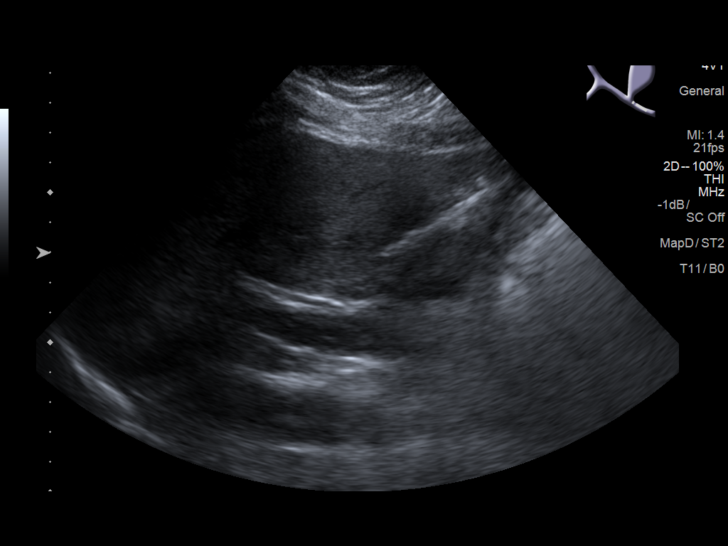
[im 21/39]
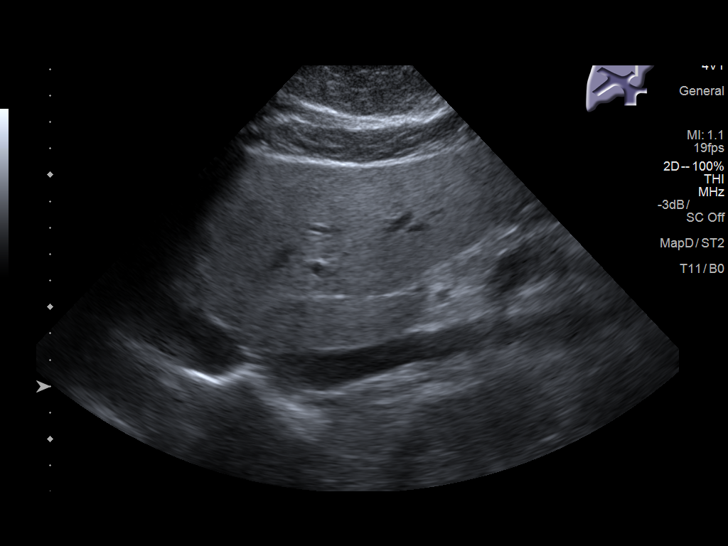
[im 24/39]
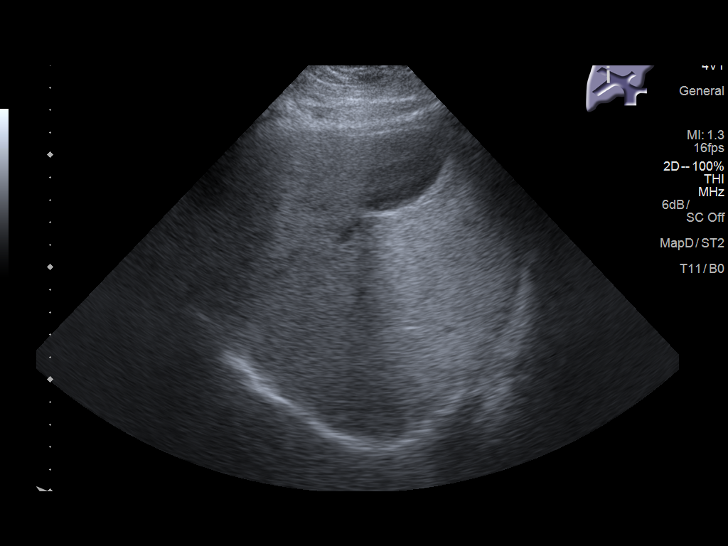
[im 26/39]
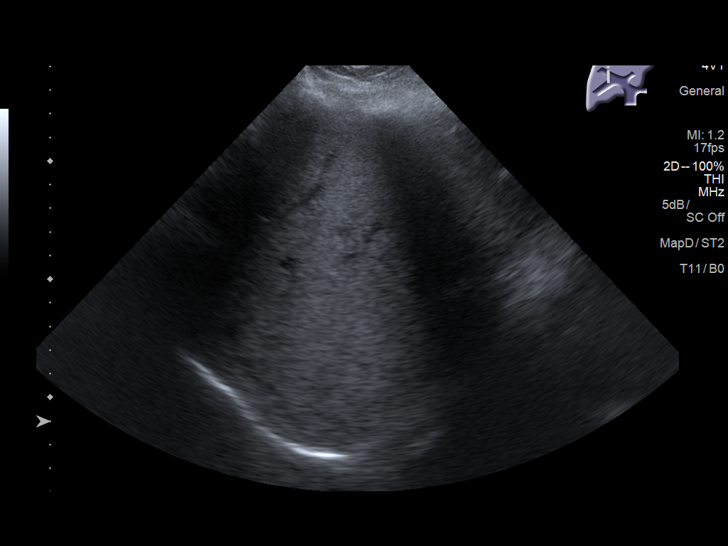
[im 29/39]
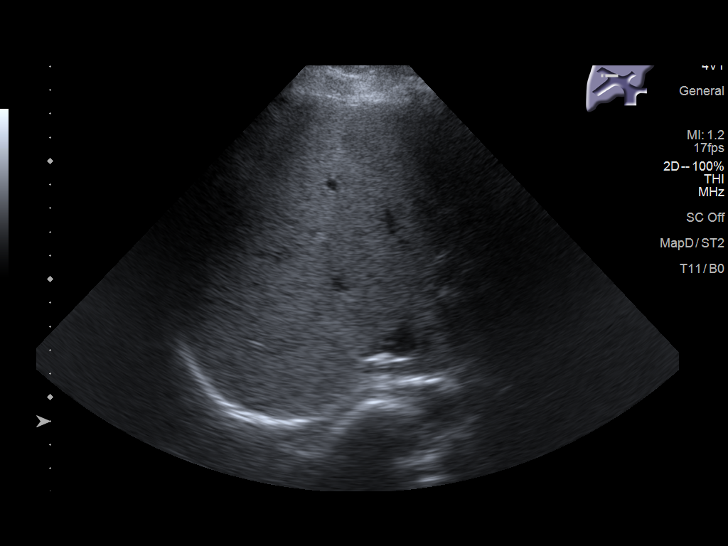
[im 32/39]
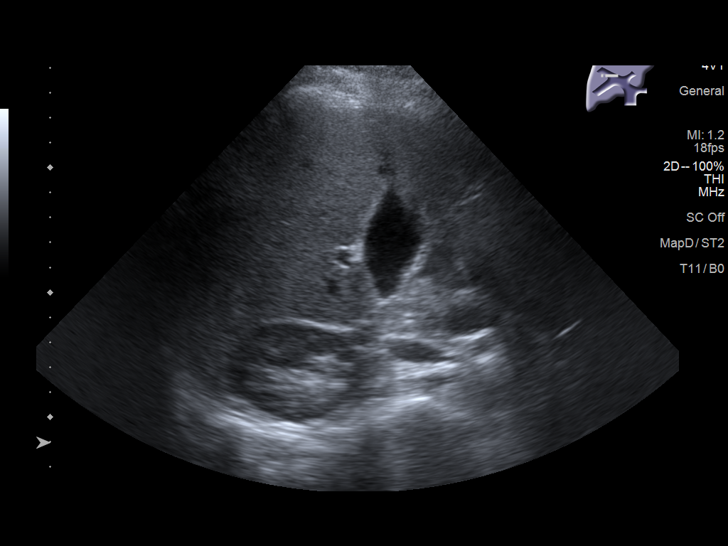
[im 35/39]
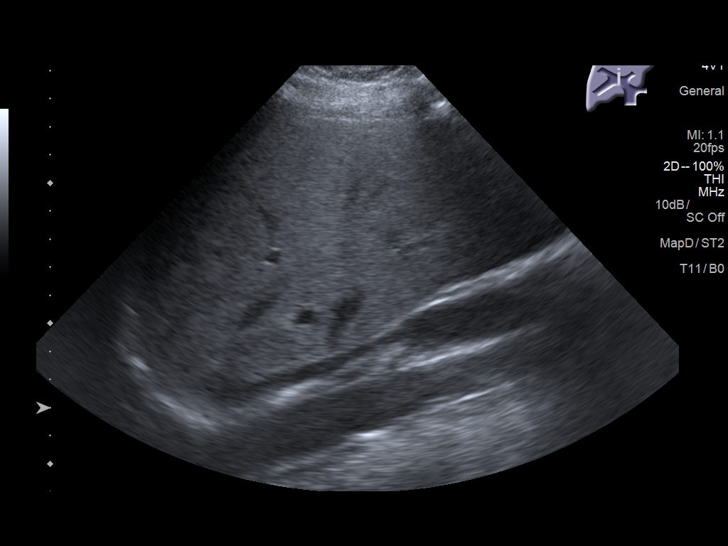
[im 39/39]
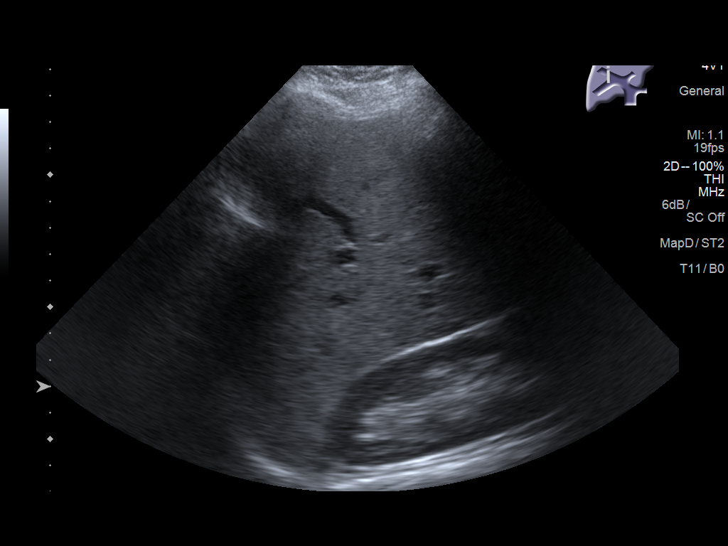

[14 of 25 positions shown; findings below may reference images not displayed]

FINDINGS: Gallbladder:

No gallstones or wall thickening visualized. No sonographic Murphy
sign noted by sonographer.

Common bile duct:

Diameter: 2.1 mm

Liver:

Mild increased echogenicity with no focal mass. Portal vein is
patent on color Doppler imaging with normal direction of blood flow
towards the liver.
IMPRESSION: Probable hepatic steatosis. The gallbladder is normal in appearance.

## 2018-05-04 ENCOUNTER — Telehealth: Payer: BC Managed Care – PPO | Admitting: Family

## 2018-05-04 DIAGNOSIS — J069 Acute upper respiratory infection, unspecified: Secondary | ICD-10-CM

## 2018-05-04 MED ORDER — BENZONATATE 100 MG PO CAPS: 100.0000 mg | ORAL_CAPSULE | Freq: Three times a day (TID) | ORAL | 0 refills | Status: DC | PRN

## 2018-05-04 MED ORDER — FLUTICASONE PROPIONATE 50 MCG/ACT NA SUSP: 1.0000 | Freq: Two times a day (BID) | NASAL | 6 refills | Status: DC

## 2018-05-04 NOTE — Progress Notes (Signed)
Greater than 5 minutes, yet less than 10 minutes of time have been spent researching, coordinating, and implementing care for this patient today.  Thank you for the details you included in the comment boxes. Those details are very helpful in determining the best course of treatment for you and help [place] to provide the best care. This is not consistent with the flu, yet may be another viral infection. See plan below.  We are sorry you are not feeling well.  Here is how we plan to help!  Based on what you have shared with me, it looks like you may have a viral upper respiratory infection.  Upper respiratory infections are caused by a large number of viruses; however, rhinovirus is the most common cause.   Symptoms vary from person to person, with common symptoms including sore throat, cough, and fatigue or lack of energy.  A low-grade fever of up to 100.4 may present, but is often uncommon.  Symptoms vary however, and are closely related to a person's age or underlying illnesses.  The most common symptoms associated with an upper respiratory infection are nasal discharge or congestion, cough, sneezing, headache and pressure in the ears and face.  These symptoms usually persist for about 3 to 10 days, but can last up to 2 weeks.  It is important to know that upper respiratory infections do not cause serious illness or complications in most cases.    Upper respiratory infections can be transmitted from person to person, with the most common method of transmission being a person's hands.  The virus is able to live on the skin and can infect other persons for up to 2 hours after direct contact.  Also, these can be transmitted when someone coughs or sneezes; thus, it is important to cover the mouth to reduce this risk.  To keep the spread of the illness at [place], good hand hygiene is very important.  This is an infection that is most likely caused by a virus. There are no specific treatments other than to help you  with the symptoms until the infection runs its course.  We are sorry you are not feeling well.  Here is how we plan to help!   For nasal congestion, you may use an oral decongestants such as Mucinex D or if you have glaucoma or high blood pressure use plain Mucinex.  Saline nasal spray or nasal drops can help and can safely be used as often as needed for congestion.  For your congestion, I have prescribed Fluticasone nasal spray one spray in each nostril twice a day  If you do not have a history of heart disease, hypertension, diabetes or thyroid disease, prostate/bladder issues or glaucoma, you may also use Sudafed to treat nasal congestion.  It is highly recommended that you consult with a pharmacist or your primary care physician to ensure this medication is safe for you to take.     If you have a cough, you may use cough suppressants such as Delsym and Robitussin.  If you have glaucoma or high blood pressure, you can also use Coricidin HBP.   For cough I have prescribed for you A prescription cough medication called Tessalon Perles 100 mg. You may take 1-2 capsules every 8 hours as needed for cough  If you have a sore or scratchy throat, use a saltwater gargle-  to  teaspoon of salt dissolved in a 4-ounce to 8-ounce glass of warm water.  Gargle the solution for approximately 15-30 seconds  and then spit.  It is important not to swallow the solution.  You can also use throat lozenges/cough drops and Chloraseptic spray to help with throat pain or discomfort.  Warm or cold liquids can also be helpful in relieving throat pain.  For headache, pain or general discomfort, you can use Ibuprofen or Tylenol as directed.   Some authorities believe that zinc sprays or the use of Echinacea may shorten the course of your symptoms.   HOME CARE . Only take medications as instructed by your medical team. . Be sure to drink plenty of fluids. Water is fine as well as fruit juices, sodas and electrolyte  beverages. You may want to stay away from caffeine or alcohol. If you are nauseated, try taking small sips of liquids. How do you know if you are getting enough fluid? Your urine should be a pale yellow or almost colorless. . Get rest. . Taking a steamy shower or using a humidifier may help nasal congestion and ease sore throat pain. You can place a towel over your head and breathe in the steam from hot water coming from a faucet. . Using a saline nasal spray works much the same way. . Cough drops, hard candies and sore throat lozenges may ease your cough. . Avoid close contacts especially the very young and the elderly . Cover your mouth if you cough or sneeze . Always remember to wash your hands.   GET HELP RIGHT AWAY IF: . You develop worsening fever. . If your symptoms do not improve within 10 days . You develop yellow or green discharge from your nose over 3 days. . You have coughing fits . You develop a severe head ache or visual changes. . You develop shortness of breath, difficulty breathing or start having chest pain . Your symptoms persist after you have completed your treatment plan  MAKE SURE YOU   Understand these instructions.  Will watch your condition.  Will get help right away if you are not doing well or get worse.  Your e-visit answers were reviewed by a board certified advanced clinical practitioner to complete your personal care plan. Depending upon the condition, your plan could have included both over the counter or prescription medications. Please review your pharmacy choice. If there is a problem, you may call our nursing hot line at and have the prescription routed to another pharmacy. Your safety is important to [place]. If you have drug allergies check your prescription carefully.   You can use MyChart to ask questions about today's visit, request a non-urgent call back, or ask for a work or school excuse for 24 hours related to this e-Visit. If it has been  greater than 24 hours you will need to follow up with your provider, or enter a new e-Visit to address those concerns. You will get an e-mail in the next two days asking about your experience.  I hope that your e-visit has been valuable and will speed your recovery. Thank you for using e-visits.

## 2018-07-14 ENCOUNTER — Other Ambulatory Visit: Payer: Self-pay | Admitting: Gastroenterology

## 2018-08-27 ENCOUNTER — Other Ambulatory Visit: Payer: Self-pay | Admitting: Gastroenterology

## 2018-11-25 ENCOUNTER — Other Ambulatory Visit: Payer: Self-pay | Admitting: Gastroenterology

## 2018-11-29 ENCOUNTER — Encounter: Payer: Self-pay | Admitting: Podiatry

## 2018-11-29 ENCOUNTER — Ambulatory Visit (INDEPENDENT_AMBULATORY_CARE_PROVIDER_SITE_OTHER): Payer: BC Managed Care – PPO

## 2018-11-29 ENCOUNTER — Other Ambulatory Visit: Payer: Self-pay

## 2018-11-29 ENCOUNTER — Ambulatory Visit (INDEPENDENT_AMBULATORY_CARE_PROVIDER_SITE_OTHER): Payer: BC Managed Care – PPO | Admitting: Podiatry

## 2018-11-29 DIAGNOSIS — M7672 Peroneal tendinitis, left leg: Secondary | ICD-10-CM

## 2018-11-29 DIAGNOSIS — M722 Plantar fascial fibromatosis: Secondary | ICD-10-CM | POA: Diagnosis not present

## 2018-11-29 NOTE — Progress Notes (Signed)
Subjective:   Patient ID: Amber Blankenship, female   DOB: 43 y.o.   MRN: [id number]   HPI Patient states skin a lot of pain in the outside of the left foot and had this several years ago and it seems like it is flared up again   ROS      Objective:  Physical Exam  Neurovascular status intact with patient found to have inflammation pain of the peroneal insertion left with fluid buildup localized in nature with no other pathology noted currently     Assessment:  Peroneal tendinitis left inflamed     Plan:  H&P x-ray reviewed sterile prep done and injected the base of fifth metatarsal peroneal insertion 3 mg Dexasone Kenalog 5 mg Xylocaine advised on ice reduced activity and reappoint as needed  X-ray was negative for signs of fracture or other pathology

## 2018-12-22 ENCOUNTER — Other Ambulatory Visit: Payer: Self-pay | Admitting: Gastroenterology

## 2019-01-18 ENCOUNTER — Other Ambulatory Visit: Payer: Self-pay | Admitting: Gastroenterology

## 2019-02-10 ENCOUNTER — Other Ambulatory Visit: Payer: Self-pay | Admitting: Gastroenterology

## 2019-04-26 ENCOUNTER — Ambulatory Visit: Payer: BC Managed Care – PPO | Attending: Internal Medicine

## 2019-04-26 DIAGNOSIS — Z23 Encounter for immunization: Secondary | ICD-10-CM | POA: Insufficient documentation

## 2019-04-26 NOTE — Progress Notes (Signed)
   Covid-19 Vaccination Clinic  Name:  Amber Blankenship    MRN: [id number] DOB: February 05, 1976  04/26/2019  Ms. Amber Blankenship was observed post Covid-19 immunization for 15 minutes without incidence. She was provided with Vaccine Information Sheet and instruction to access the V-Safe system.   Ms. Amber Blankenship was instructed to call 911 with any severe reactions post vaccine: Marland Kitchen Difficulty breathing  . Swelling of your face and throat  . A fast heartbeat  . A bad rash all over your body  . Dizziness and weakness    Immunizations Administered    Name Date Dose VIS Date Route   Pfizer COVID-19 Vaccine 04/26/2019  9:30 AM 0.3 mL 02/09/2019 Intramuscular   Manufacturer: [place]   Lot: [id number]   [place]: [id number]

## 2019-05-22 ENCOUNTER — Ambulatory Visit: Payer: BC Managed Care – PPO | Attending: Internal Medicine

## 2019-05-22 DIAGNOSIS — Z23 Encounter for immunization: Secondary | ICD-10-CM

## 2019-05-22 NOTE — Progress Notes (Signed)
   Covid-19 Vaccination Clinic  Name:  ONETA SIGMAN    MRN: [id number] DOB: Dec 02, 1975  05/22/2019  Ms. Bossman was observed post Covid-19 immunization for 15 minutes without incident. She was provided with Vaccine Information Sheet and instruction to access the V-Safe system.   Ms. Gavel was instructed to call 911 with any severe reactions post vaccine: Marland Kitchen Difficulty breathing  . Swelling of face and throat  . A fast heartbeat  . A bad rash all over body  . Dizziness and weakness   Immunizations Administered    Name Date Dose VIS Date Route   Pfizer COVID-19 Vaccine 05/22/2019  8:48 AM 0.3 mL 02/09/2019 Intramuscular   Manufacturer: [place]   Lot: [id number]   [place]: [id number]

## 2019-07-24 ENCOUNTER — Ambulatory Visit: Payer: Self-pay | Admitting: Surgery

## 2019-08-06 ENCOUNTER — Other Ambulatory Visit: Payer: Self-pay

## 2019-08-06 ENCOUNTER — Encounter (HOSPITAL_BASED_OUTPATIENT_CLINIC_OR_DEPARTMENT_OTHER): Payer: Self-pay | Admitting: Surgery

## 2019-08-11 ENCOUNTER — Other Ambulatory Visit (HOSPITAL_COMMUNITY)
Admission: RE | Admit: 2019-08-11 | Discharge: 2019-08-11 | Disposition: A | Discharge status: Home / Self Care | Payer: BC Managed Care – PPO | Source: Ambulatory Visit | Attending: Surgery | Admitting: Surgery

## 2019-08-11 DIAGNOSIS — Z20822 Contact with and (suspected) exposure to covid-19: Secondary | ICD-10-CM | POA: Diagnosis not present

## 2019-08-11 DIAGNOSIS — Z01812 Encounter for preprocedural laboratory examination: Secondary | ICD-10-CM | POA: Insufficient documentation

## 2019-08-11 LAB — SARS CORONAVIRUS 2 (TAT 6-24 HRS): SARS Coronavirus 2: NEGATIVE

## 2019-08-14 ENCOUNTER — Ambulatory Visit (HOSPITAL_BASED_OUTPATIENT_CLINIC_OR_DEPARTMENT_OTHER): Payer: BC Managed Care – PPO | Admitting: Certified Registered"

## 2019-08-14 ENCOUNTER — Other Ambulatory Visit: Payer: Self-pay

## 2019-08-14 ENCOUNTER — Encounter (HOSPITAL_BASED_OUTPATIENT_CLINIC_OR_DEPARTMENT_OTHER)
Admission: RE | Disposition: A | Discharge status: Home / Self Care | Payer: Self-pay | Source: Home / Self Care | Attending: Surgery

## 2019-08-14 ENCOUNTER — Ambulatory Visit (HOSPITAL_BASED_OUTPATIENT_CLINIC_OR_DEPARTMENT_OTHER)
Admission: RE | Admit: 2019-08-14 | Discharge: 2019-08-14 | Disposition: A | Discharge status: Home / Self Care | Payer: BC Managed Care – PPO | Attending: Surgery | Admitting: Surgery

## 2019-08-14 DIAGNOSIS — F17200 Nicotine dependence, unspecified, uncomplicated: Secondary | ICD-10-CM | POA: Insufficient documentation

## 2019-08-14 DIAGNOSIS — K644 Residual hemorrhoidal skin tags: Secondary | ICD-10-CM | POA: Diagnosis present

## 2019-08-14 DIAGNOSIS — K648 Other hemorrhoids: Secondary | ICD-10-CM | POA: Diagnosis not present

## 2019-08-14 HISTORY — DX: Gastro-esophageal reflux disease without esophagitis: K21.9

## 2019-08-14 HISTORY — PX: EVALUATION UNDER ANESTHESIA WITH HEMORRHOIDECTOMY: SHX5624

## 2019-08-14 HISTORY — PX: MASS EXCISION: SHX2000

## 2019-08-14 LAB — POCT PREGNANCY, URINE: Preg Test, Ur: NEGATIVE

## 2019-08-14 SURGERY — EXAM UNDER ANESTHESIA WITH HEMORRHOIDECTOMY
Anesthesia: Monitor Anesthesia Care | Site: Rectum

## 2019-08-14 MED ORDER — MIDAZOLAM HCL 2 MG/2ML IJ SOLN
INTRAMUSCULAR | Status: AC
  Filled 2019-08-14: qty 2

## 2019-08-14 MED ORDER — OXYCODONE HCL 5 MG PO TABS: 5.0000 mg | ORAL_TABLET | ORAL | 0 refills | Status: AC | PRN

## 2019-08-14 MED ORDER — PROMETHAZINE HCL 25 MG/ML IJ SOLN: 6.2500 mg | INTRAMUSCULAR | Status: DC | PRN

## 2019-08-14 MED ORDER — BUPIVACAINE LIPOSOME 1.3 % IJ SUSP
INTRAMUSCULAR | Status: DC | PRN
  Administered 2019-08-14: 17.5 mL

## 2019-08-14 MED ORDER — SODIUM CHLORIDE 0.9 % IV SOLN: 2.0000 g | INTRAVENOUS | Status: DC

## 2019-08-14 MED ORDER — FENTANYL CITRATE (PF) 100 MCG/2ML IJ SOLN
INTRAMUSCULAR | Status: AC
  Filled 2019-08-14: qty 2

## 2019-08-14 MED ORDER — CEFAZOLIN SODIUM-DEXTROSE 2-4 GM/100ML-% IV SOLN
INTRAVENOUS | Status: AC
  Filled 2019-08-14: qty 100

## 2019-08-14 MED ORDER — BUPIVACAINE LIPOSOME 1.3 % IJ SUSP
INTRAMUSCULAR | Status: AC
  Filled 2019-08-14: qty 20

## 2019-08-14 MED ORDER — ENOXAPARIN SODIUM 40 MG/0.4ML ~~LOC~~ SOLN
SUBCUTANEOUS | Status: AC
  Filled 2019-08-14: qty 0.4

## 2019-08-14 MED ORDER — CEFAZOLIN SODIUM-DEXTROSE 2-4 GM/100ML-% IV SOLN
2.0000 g | INTRAVENOUS | Status: AC
  Administered 2019-08-14: 2 g via INTRAVENOUS

## 2019-08-14 MED ORDER — CHLORHEXIDINE GLUCONATE CLOTH 2 % EX PADS: 6.0000 | MEDICATED_PAD | Freq: Once | CUTANEOUS | Status: DC

## 2019-08-14 MED ORDER — FENTANYL CITRATE (PF) 100 MCG/2ML IJ SOLN
INTRAMUSCULAR | Status: DC | PRN
  Administered 2019-08-14: 100 ug via INTRAVENOUS

## 2019-08-14 MED ORDER — OXYCODONE HCL 5 MG PO TABS
ORAL_TABLET | ORAL | Status: AC
  Filled 2019-08-14: qty 1

## 2019-08-14 MED ORDER — ONDANSETRON HCL 4 MG/2ML IJ SOLN
INTRAMUSCULAR | Status: DC | PRN
  Administered 2019-08-14: 4 mg via INTRAVENOUS

## 2019-08-14 MED ORDER — OXYCODONE HCL 5 MG PO TABS
5.0000 mg | ORAL_TABLET | Freq: Once | ORAL | Status: AC | PRN
  Administered 2019-08-14: 5 mg via ORAL

## 2019-08-14 MED ORDER — IBUPROFEN 800 MG PO TABS
800.0000 mg | ORAL_TABLET | Freq: Three times a day (TID) | ORAL | 0 refills | Status: AC
Start: 2019-08-14 — End: ?

## 2019-08-14 MED ORDER — HYDROMORPHONE HCL 1 MG/ML IJ SOLN: 0.2500 mg | INTRAMUSCULAR | Status: DC | PRN

## 2019-08-14 MED ORDER — PROPOFOL 500 MG/50ML IV EMUL
INTRAVENOUS | Status: DC | PRN
  Administered 2019-08-14: 75 ug/kg/min via INTRAVENOUS

## 2019-08-14 MED ORDER — ACETAMINOPHEN 500 MG PO TABS
1000.0000 mg | ORAL_TABLET | ORAL | Status: AC
  Administered 2019-08-14: 1000 mg via ORAL

## 2019-08-14 MED ORDER — PROPOFOL 10 MG/ML IV BOLUS
INTRAVENOUS | Status: DC | PRN
Start: 2019-08-14 — End: 2019-08-14
  Administered 2019-08-14: 10 mg via INTRAVENOUS
  Administered 2019-08-14: 20 mg via INTRAVENOUS
  Administered 2019-08-14 (×3): 10 mg via INTRAVENOUS

## 2019-08-14 MED ORDER — OXYCODONE HCL 5 MG/5ML PO SOLN: 5.0000 mg | Freq: Once | ORAL | Status: AC | PRN

## 2019-08-14 MED ORDER — ONDANSETRON HCL 4 MG/2ML IJ SOLN
INTRAMUSCULAR | Status: AC
  Filled 2019-08-14: qty 2

## 2019-08-14 MED ORDER — PROPOFOL 10 MG/ML IV BOLUS
INTRAVENOUS | Status: AC
  Filled 2019-08-14: qty 20

## 2019-08-14 MED ORDER — LACTATED RINGERS IV SOLN: INTRAVENOUS | Status: DC

## 2019-08-14 MED ORDER — BUPIVACAINE HCL (PF) 0.5 % IJ SOLN
INTRAMUSCULAR | Status: DC | PRN
  Administered 2019-08-14: 17.5 mL

## 2019-08-14 MED ORDER — MIDAZOLAM HCL 5 MG/5ML IJ SOLN
INTRAMUSCULAR | Status: DC | PRN
  Administered 2019-08-14: 2 mg via INTRAVENOUS

## 2019-08-14 MED ORDER — SODIUM CHLORIDE 0.9 % IV SOLN
INTRAVENOUS | Status: AC
  Filled 2019-08-14: qty 2

## 2019-08-14 MED ORDER — ENOXAPARIN SODIUM 40 MG/0.4ML ~~LOC~~ SOLN
40.0000 mg | Freq: Once | SUBCUTANEOUS | Status: AC
  Administered 2019-08-14: 40 mg via SUBCUTANEOUS

## 2019-08-14 MED ORDER — ACETAMINOPHEN 500 MG PO TABS
ORAL_TABLET | ORAL | Status: AC
  Filled 2019-08-14: qty 2

## 2019-08-14 SURGICAL SUPPLY — 40 items
BLADE SURG 15 STRL LF DISP TIS (BLADE) ×2 IMPLANT
BLADE SURG 15 STRL SS (BLADE) ×4
BRIEF STRETCH FOR OB PAD XXL (UNDERPADS AND DIAPERS) IMPLANT
CANISTER SUCT 1200ML W/VALVE (MISCELLANEOUS) ×4 IMPLANT
COVER MAYO STAND STRL (DRAPES) IMPLANT
COVER WAND RF STERILE (DRAPES) IMPLANT
DECANTER SPIKE VIAL GLASS SM (MISCELLANEOUS) ×4 IMPLANT
DRAPE UTILITY XL STRL (DRAPES) ×4 IMPLANT
DRSG PAD ABDOMINAL 8X10 ST (GAUZE/BANDAGES/DRESSINGS) ×4 IMPLANT
ELECT REM PT RETURN 9FT ADLT (ELECTROSURGICAL) ×4
ELECTRODE REM PT RTRN 9FT ADLT (ELECTROSURGICAL) ×2 IMPLANT
GAUZE SPONGE 4X4 12PLY STRL LF (GAUZE/BANDAGES/DRESSINGS) ×4 IMPLANT
GLOVE BIO SURGEON STRL SZ 6.5 (GLOVE) ×3 IMPLANT
GLOVE BIO SURGEONS STRL SZ 6.5 (GLOVE) ×1
GLOVE BIOGEL PI IND STRL 6 (GLOVE) ×2 IMPLANT
GLOVE BIOGEL PI INDICATOR 6 (GLOVE) ×2
GOWN STRL REUS W/ TWL LRG LVL3 (GOWN DISPOSABLE) ×4 IMPLANT
GOWN STRL REUS W/TWL LRG LVL3 (GOWN DISPOSABLE) ×8
NDL HYPO 25X1 1.5 SAFETY (NEEDLE) ×2 IMPLANT
NEEDLE HYPO 25X1 1.5 SAFETY (NEEDLE) ×4 IMPLANT
PACK LITHOTOMY IV (CUSTOM PROCEDURE TRAY) ×4 IMPLANT
PENCIL SMOKE EVACUATOR (MISCELLANEOUS) ×4 IMPLANT
SET BASIN DAY SURGERY F.S. (CUSTOM PROCEDURE TRAY) ×4 IMPLANT
SHEARS HARMONIC 9CM CVD (BLADE) ×2 IMPLANT
SHEET MEDIUM DRAPE 40X70 STRL (DRAPES) ×4 IMPLANT
SLEEVE SCD COMPRESS KNEE MED (MISCELLANEOUS) ×4 IMPLANT
SPONGE HEMORRHOID 8X3CM (HEMOSTASIS) ×2 IMPLANT
SPONGE SURGIFOAM ABS GEL 100 (HEMOSTASIS) IMPLANT
SPONGE SURGIFOAM ABS GEL 12-7 (HEMOSTASIS) IMPLANT
SURGILUBE 2OZ TUBE FLIPTOP (MISCELLANEOUS) ×4 IMPLANT
SUT CHROMIC 2 0 SH (SUTURE) IMPLANT
SUT CHROMIC 3 0 SH 27 (SUTURE) ×2 IMPLANT
SYR CONTROL 10ML LL (SYRINGE) ×4 IMPLANT
TOWEL GREEN STERILE FF (TOWEL DISPOSABLE) ×4 IMPLANT
TRAY DSU PREP LF (CUSTOM PROCEDURE TRAY) ×4 IMPLANT
TRAY PROCTOSCOPIC FIBER OPTIC (SET/KITS/TRAYS/PACK) IMPLANT
TUBE CONNECTING 20'X1/4 (TUBING) ×1
TUBE CONNECTING 20X1/4 (TUBING) ×3 IMPLANT
UNDERPAD 30X36 HEAVY ABSORB (UNDERPADS AND DIAPERS) ×4 IMPLANT
YANKAUER SUCT BULB TIP NO VENT (SUCTIONS) ×4 IMPLANT

## 2019-08-14 NOTE — H&P (Addendum)
    Amber Blankenship is an 44 y.o. female.   HPI: 26F with external hemorrhoids presents for EUA and hemorrhoidectomy. The patient has had no hospitalizations, doctors visits, ER visits, surgeries, or newly diagnosed allergies since being seen in clinic.    Past Medical History:  Diagnosis Date  . Allergy   . Anal fistula anal fissure  . Anxiety   . GERD (gastroesophageal reflux disease)   . Hemorrhoids     Past Surgical History:  Procedure Laterality Date  . ANAL FISSURE REPAIR  2012  . BLADDER SUSPENSION  01/24/2012   Procedure: TRANSVAGINAL TAPE (TVT) PROCEDURE;  Surgeon: Cheri Fowler, MD;  Location: [facility];  Service: Gynecology;  Laterality: N/A;  Solyx sling, possible TVT  . LASER ABLATION     uterine  . TUBAL LIGATION      Family History  Problem Relation Age of Onset  . Other Mother        not a stroke but something close to it  . Celiac disease Sister   . Breast cancer Maternal Grandmother   . Diabetes Maternal Grandmother   . Celiac disease Other        niece  . Cystic fibrosis Other        niece  . Diabetes Maternal Aunt   . Colon cancer Daughter   . Esophageal cancer Daughter     Social History:  reports that she has been smoking. She has never used smokeless tobacco. She reports that she does not drink alcohol and does not use drugs.  Allergies: No Known Allergies  Medications: I have reviewed the patient's current medications.  Results for orders placed or performed during the hospital encounter of 08/14/19 (from the past 48 hour(s))  Pregnancy, urine POC     Status: None   Collection Time: 08/14/19 10:24 AM  Result Value Ref Range   Preg Test, Ur NEGATIVE NEGATIVE    Comment:        THE SENSITIVITY OF THIS METHODOLOGY IS >24 mIU/mL     No results found.  ROS 10 point review of systems is negative except as listed above in HPI.   Physical Exam Blood pressure 121/71, pulse 65, temperature 98.3 F (36.8 C), temperature source Oral, resp.  rate 18, height 5' 4"  (1.626 m), weight 94.2 kg, SpO2 99 %. Constitutional: well-developed, well-nourished HEENT: pupils equal, round, reactive to light, 41m b/l, moist conjunctiva, external inspection of ears and nose normal, hearing intact Oropharynx: normal oropharyngeal mucosa, normal dentition Neck: no thyromegaly, trachea midline, no midline cervical tenderness to palpation Chest: breath sounds equal bilaterally, normal respiratory effort, no midline or lateral chest wall tenderness to palpation/deformity Abdomen: soft, NT, no bruising, no hepatosplenomegaly GU: normal female genitalia  Back: no wounds, no thoracic/lumbar spine tenderness to palpation, no thoracic/lumbar spine stepoffs Rectal: deferred Extremities: 2+ radial and pedal pulses bilaterally, motor and sensation intact to bilateral UE and LE, no peripheral edema MSK: normal gait/station, no clubbing/cyanosis of fingers/toes, normal ROM of all four extremities Skin: warm, dry, no rashes Psych: normal memory, normal mood/affect    Assessment/Plan: 76F with hemorrhoids presents for EUA and hemorrhoidectomy. Informed consent was obtained after detailed explanation of risks, including bleeding, infection, anal canal stenosis,and incontinence. All questions answered to the patient's satisfaction.  Husband, KJovonna Nickell [phone] AJesusita Oka MD General and [facility]

## 2019-08-14 NOTE — Anesthesia Preprocedure Evaluation (Signed)
Anesthesia Evaluation  Patient identified by MRN, date of birth, ID band Patient awake    Reviewed: Allergy & Precautions, H&P , Patient's Chart, lab work & pertinent test results, reviewed documented beta blocker date and time   History of Anesthesia Complications Negative for: history of anesthetic complications  Airway Mallampati: II  TM Distance: >3 FB Neck ROM: full    Dental no notable dental hx.    Pulmonary neg pulmonary ROS, Current Smoker and Patient abstained from smoking.,    Pulmonary exam normal breath sounds clear to auscultation       Cardiovascular Exercise Tolerance: Good negative cardio ROS   Rhythm:regular Rate:Normal     Neuro/Psych PSYCHIATRIC DISORDERS Anxiety negative neurological ROS  negative psych ROS   GI/Hepatic negative GI ROS, Neg liver ROS, GERD  Controlled,  Endo/Other  negative endocrine ROS  Renal/GU negative Renal ROS     Musculoskeletal   Abdominal (+) + obese,   Peds  Hematology negative hematology ROS (+)   Anesthesia Other Findings Hemorrhoids     Anxiety            Reproductive/Obstetrics negative OB ROS                             Anesthesia Physical  Anesthesia Plan  ASA: II  Anesthesia Plan: MAC   Post-op Pain Management:    Induction: Intravenous  PONV Risk Score and Plan: 1 and Ondansetron and Treatment may vary due to age or medical condition  Airway Management Planned: Simple Face Mask  Additional Equipment:   Intra-op Plan:   Post-operative Plan:   Informed Consent: I have reviewed the patients History and Physical, chart, labs and discussed the procedure including the risks, benefits and alternatives for the proposed anesthesia with the patient or authorized representative who has indicated his/her understanding and acceptance.     Dental Advisory Given  Plan Discussed with: CRNA, Surgeon and  Anesthesiologist  Anesthesia Plan Comments:         Anesthesia Quick Evaluation

## 2019-08-14 NOTE — Transfer of Care (Signed)
Immediate Anesthesia Transfer of Care Note  Patient: Amber Blankenship  Procedure(s) Performed: EXAM UNDER ANESTHESIA WITH OPEN HEMORRHOIDECTOMY (N/A ) EXCISION perineal skin tag (Rectum)  Patient Location: PACU  Anesthesia Type:MAC  Level of Consciousness: awake, alert , oriented and patient cooperative  Airway & Oxygen Therapy: Patient Spontanous Breathing and Patient connected to face mask oxygen  Post-op Assessment: Report given to RN and Post -op Vital signs reviewed and stable  Post vital signs: Reviewed and stable  Last Vitals:  Vitals Value Taken Time  BP 105/51   Temp    Pulse 78 08/14/19 1311  Resp 17 08/14/19 1311  SpO2 100 % 08/14/19 1311  Vitals shown include unvalidated device data.  Last Pain:  Vitals:   08/14/19 1035  TempSrc: Oral  PainSc: 3       Patients Stated Pain Goal: 2 (90/12/22 4114)  Complications: No complications documented.

## 2019-08-14 NOTE — Anesthesia Postprocedure Evaluation (Signed)
Anesthesia Post Note  Patient: Amber Blankenship  Procedure(s) Performed: EXAM UNDER ANESTHESIA WITH OPEN HEMORRHOIDECTOMY (N/A ) EXCISION perineal skin tag (Rectum)     Patient location during evaluation: PACU Anesthesia Type: MAC Level of consciousness: awake and alert Pain management: pain level controlled Vital Signs Assessment: post-procedure vital signs reviewed and stable Respiratory status: spontaneous breathing, nonlabored ventilation and respiratory function stable Cardiovascular status: blood pressure returned to baseline and stable Postop Assessment: no apparent nausea or vomiting Anesthetic complications: no   No complications documented.  Last Vitals:  Vitals:   08/14/19 1324 08/14/19 1330  BP:  115/76  Pulse: 67 62  Resp: 20 17  Temp:    SpO2: 99% 100%    Last Pain:  Vitals:   08/14/19 1354  TempSrc:   PainSc: 3                  Lynda Rainwater

## 2019-08-14 NOTE — Op Note (Signed)
   Operative Note   Date: 08/14/2019  Procedure: exam under anesthesia, right posterior hemorrhoidectomy, excision of perineal skin tag  Pre-op diagnosis: external hemorrhoids, perineal skin tag Post-op diagnosis: external and internal hemorrhoids, perineal skin tag,   Indication and clinical history: The patient is a 44 y.o. year old female with symptomatic hemorrhoids     Surgeon: Jesusita Oka, MD Assistant: Marcello Moores, MD  Anesthesia: MAC  Findings:  Specimen: external and internal hermorrhoid, right posterior, perineal skin tag EBL: <5cc Drains/Implants: gelfoam roll  Disposition: PACU - hemodynamically stable.  Description of procedure: The patient was positioned prone on the operating room table. MAC anesthetic induction was uneventful. Time-out was performed verifying correct patient, procedure, signature of informed consent, and administration of pre-operative antibiotics.   The patient was repositioned to jackknife and gluteal folds taped apart. The anus and peri-rectal tissues were prepped and draped in the usual sterile fashion. Exparel was injected at the ischial tuberosities bilaterally as well as in the intersphincteric groove. The [facility] retractor was inserted into the anal canal and internal and external hemorrhoids noted at the right posterior position. The skin was incised sharply and both hemorrhoidal columns excised using the Harmonic while continuously ensuring that the plane was above the sphincter muscles. Hemostasis was achieved. The mucosa of the anal canal was re-approximated with chromic suture in a running locking fashion with a lateral drainage opening. Next, the anterior perineal skin tag was sharply excised and the skin edges re-approximated with chromic suture in an interrupted fashion. A rolled up gelfoam was inserted into the anal canal.   Sterile dressings were applied. All sponge and instrument counts were correct at the conclusion of the  procedure. The patient was awakened from anesthesia and transported to the PACU in good condition. There were no complications.    Jesusita Oka, MD General and [facility]

## 2019-08-14 NOTE — Discharge Instructions (Addendum)
May shower beginning 08/15/2019. Be sure to shower at least twice daily, may shower more if needed. May allow warm soapy water to run over incision, then rinse and pat dry. Do not soak in any water (tubs, hot tubs, pools, lakes, oceans) for two weeks. Expect a expel a piece of foam with your first bowel movement.  Keep wound clean and dry, may apply a dry dressing to the wound if drainage is present or if clothing is irritating to the sutures, otherwise keep wound open to air. If applying a dressing, change at least twice daily, may change more frequently as needed. Avoid topical products such as neosporin, bacitracin, etc, to minimize moisture to the area.   Minimize sitting as much as possible for the first two weeks after surgery, try to stand or lay down instead of sitting.   Please take your stool softeners to minimize constipation and the need to sit on the toilet for prolonged time.   Call the office at [phone] for temperature greater than 101.39F, worsening pain, redness or warmth at the incision site.  Please call [phone] to make an appointment for 2 weeks after surgery for wound check.     Oxycodone 5 mg given at 2:00 Pm  No tylenol products until 5:00Pm   Post Anesthesia Home Care Instructions  Activity: Get plenty of rest for the remainder of the day. A responsible individual must stay with you for 24 hours following the procedure.  For the next 24 hours, DO NOT: -Drive a car -Paediatric nurse -Drink alcoholic beverages -Take any medication unless instructed by your physician -Make any legal decisions or sign important papers.  Meals: Start with liquid foods such as gelatin or soup. Progress to regular foods as tolerated. Avoid greasy, spicy, heavy foods. If nausea and/or vomiting occur, drink only clear liquids until the nausea and/or vomiting subsides. Call your physician if vomiting continues.  Special Instructions/Symptoms: Your throat may feel dry or sore  from the anesthesia or the breathing tube placed in your throat during surgery. If this causes discomfort, gargle with warm salt water. The discomfort should disappear within 24 hours.  If you had a scopolamine patch placed behind your ear for the management of post- operative nausea and/or vomiting:  1. The medication in the patch is effective for 72 hours, after which it should be removed.  Wrap patch in a tissue and discard in the trash. Wash hands thoroughly with soap and water. 2. You may remove the patch earlier than 72 hours if you experience unpleasant side effects which may include dry mouth, dizziness or visual disturbances. 3. Avoid touching the patch. Wash your hands with soap and water after contact with the patch.      Information for Discharge Teaching: EXPAREL (bupivacaine liposome injectable suspension)   Your surgeon or anesthesiologist gave you EXPAREL(bupivacaine) to help control your pain after surgery.   EXPAREL is a local anesthetic that provides pain relief by numbing the tissue around the surgical site.  EXPAREL is designed to release pain medication over time and can control pain for up to 72 hours.  Depending on how you respond to EXPAREL, you may require less pain medication during your recovery.  Possible side effects:  Temporary loss of sensation or ability to move in the area where bupivacaine was injected.  Nausea, vomiting, constipation  Rarely, numbness and tingling in your mouth or lips, lightheadedness, or anxiety may occur.  Call your doctor right away if you think you may be experiencing  any of these sensations, or if you have other questions regarding possible side effects.  Follow all other discharge instructions given to you by your surgeon or nurse. Eat a healthy diet and drink plenty of water or other fluids.  If you return to the hospital for any reason within 96 hours following the administration of EXPAREL, it is important for health care  providers to know that you have received this anesthetic. A teal colored band has been placed on your arm with the date, time and amount of EXPAREL you have received in order to alert and inform your health care providers. Please leave this armband in place for the full 96 hours following administration, and then you may remove the band.

## 2019-08-15 ENCOUNTER — Encounter (HOSPITAL_BASED_OUTPATIENT_CLINIC_OR_DEPARTMENT_OTHER): Payer: Self-pay | Admitting: Surgery

## 2019-08-15 LAB — SURGICAL PATHOLOGY

## 2019-10-09 ENCOUNTER — Other Ambulatory Visit: Payer: Self-pay | Admitting: Gastroenterology

## 2019-10-27 ENCOUNTER — Other Ambulatory Visit: Payer: Self-pay | Admitting: Gastroenterology

## 2019-11-24 ENCOUNTER — Other Ambulatory Visit: Payer: Self-pay | Admitting: Gastroenterology

## 2019-12-25 ENCOUNTER — Other Ambulatory Visit: Payer: Self-pay | Admitting: Gastroenterology

## 2020-01-25 ENCOUNTER — Telehealth: Payer: BC Managed Care – PPO | Admitting: Family

## 2020-01-25 DIAGNOSIS — J069 Acute upper respiratory infection, unspecified: Secondary | ICD-10-CM | POA: Diagnosis not present

## 2020-01-25 MED ORDER — PREDNISONE 10 MG (21) PO TBPK: ORAL_TABLET | ORAL | 0 refills | Status: AC

## 2020-01-25 MED ORDER — FLUTICASONE PROPIONATE 50 MCG/ACT NA SUSP: 2.0000 | Freq: Every day | NASAL | 6 refills | Status: AC

## 2020-01-25 NOTE — Addendum Note (Signed)
Addended by: Evelina Dun A on: 01/25/2020 01:56 PM   Modules accepted: Orders

## 2020-01-25 NOTE — Progress Notes (Signed)
We are sorry you are not feeling well.  Here is how we plan to help!  Based on what you have shared with me, it looks like you may have a viral upper respiratory infection.  Upper respiratory infections are caused by a large number of viruses; however, rhinovirus is the most common cause.   Symptoms vary from person to person, with common symptoms including sore throat, cough, fatigue or lack of energy and feeling of general discomfort.  A low-grade fever of up to 100.4 may present, but is often uncommon.  Symptoms vary however, and are closely related to a person's age or underlying illnesses.  The most common symptoms associated with an upper respiratory infection are nasal discharge or congestion, cough, sneezing, headache and pressure in the ears and face.  These symptoms usually persist for about 3 to 10 days, but can last up to 2 weeks.  It is important to know that upper respiratory infections do not cause serious illness or complications in most cases.    Upper respiratory infections can be transmitted from person to person, with the most common method of transmission being a person's hands.  The virus is able to live on the skin and can infect other persons for up to 2 hours after direct contact.  Also, these can be transmitted when someone coughs or sneezes; thus, it is important to cover the mouth to reduce this risk.  To keep the spread of the illness at [place], good hand hygiene is very important.  This is an infection that is most likely caused by a virus. There are no specific treatments other than to help you with the symptoms until the infection runs its course.  We are sorry you are not feeling well.  Here is how we plan to help!   For nasal congestion, you may use an oral decongestants such as Mucinex D or if you have glaucoma or high blood pressure use plain Mucinex.  Saline nasal spray or nasal drops can help and can safely be used as often as needed for congestion.  For your congestion,  I have prescribed Fluticasone nasal spray one spray in each nostril twice a day  If you do not have a history of heart disease, hypertension, diabetes or thyroid disease, prostate/bladder issues or glaucoma, you may also use Sudafed to treat nasal congestion.  It is highly recommended that you consult with a pharmacist or your primary care physician to ensure this medication is safe for you to take.     If you have a cough, you may use cough suppressants such as Delsym and Robitussin.  If you have glaucoma or high blood pressure, you can also use Coricidin HBP.     If you have a sore or scratchy throat, use a saltwater gargle-  to  teaspoon of salt dissolved in a 4-ounce to 8-ounce glass of warm water.  Gargle the solution for approximately 15-30 seconds and then spit.  It is important not to swallow the solution.  You can also use throat lozenges/cough drops and Chloraseptic spray to help with throat pain or discomfort.  Warm or cold liquids can also be helpful in relieving throat pain.  For headache, pain or general discomfort, you can use Ibuprofen or Tylenol as directed.   Some authorities believe that zinc sprays or the use of Echinacea may shorten the course of your symptoms.   HOME CARE . Only take medications as instructed by your medical team. . Be sure to drink  plenty of fluids. Water is fine as well as fruit juices, sodas and electrolyte beverages. You may want to stay away from caffeine or alcohol. If you are nauseated, try taking small sips of liquids. How do you know if you are getting enough fluid? Your urine should be a pale yellow or almost colorless. . Get rest. . Taking a steamy shower or using a humidifier may help nasal congestion and ease sore throat pain. You can place a towel over your head and breathe in the steam from hot water coming from a faucet. . Using a saline nasal spray works much the same way. . Cough drops, hard candies and sore throat lozenges may ease your  cough. . Avoid close contacts especially the very young and the elderly . Cover your mouth if you cough or sneeze . Always remember to wash your hands.   GET HELP RIGHT AWAY IF: . You develop worsening fever. . If your symptoms do not improve within 10 days . You develop yellow or green discharge from your nose over 3 days. . You have coughing fits . You develop a severe head ache or visual changes. . You develop shortness of breath, difficulty breathing or start having chest pain . Your symptoms persist after you have completed your treatment plan  MAKE SURE YOU   Understand these instructions.  Will watch your condition.  Will get help right away if you are not doing well or get worse.  Your e-visit answers were reviewed by a board certified advanced clinical practitioner to complete your personal care plan. Depending upon the condition, your plan could have included both over the counter or prescription medications. Please review your pharmacy choice. If there is a problem, you may call our nursing hot line at and have the prescription routed to another pharmacy. Your safety is important to [place]. If you have drug allergies check your prescription carefully.   You can use MyChart to ask questions about today's visit, request a non-urgent call back, or ask for a work or school excuse for 24 hours related to this e-Visit. If it has been greater than 24 hours you will need to follow up with your provider, or enter a new e-Visit to address those concerns. You will get an e-mail in the next two days asking about your experience.  I hope that your e-visit has been valuable and will speed your recovery. Thank you for using e-visits.  Approximately 5 minutes was spent documenting and reviewing patient's chart.

## 2020-04-07 ENCOUNTER — Telehealth: Payer: BC Managed Care – PPO | Admitting: Family

## 2020-04-07 DIAGNOSIS — R399 Unspecified symptoms and signs involving the genitourinary system: Secondary | ICD-10-CM

## 2020-04-07 DIAGNOSIS — M549 Dorsalgia, unspecified: Secondary | ICD-10-CM

## 2020-04-07 DIAGNOSIS — R509 Fever, unspecified: Secondary | ICD-10-CM

## 2020-04-07 NOTE — Progress Notes (Signed)
Based on what you shared with me, I feel your condition warrants further evaluation and I recommend that you be seen for a face to face office visit.  Given your fever, back pain and UTI symptoms you need to be seen face to face.    NOTE: If you entered your credit card information for this eVisit, you will not be charged. You may see a "hold" on your card for the $35 but that hold will drop off and you will not have a charge processed.   If you are having a true medical emergency please call 911.      For an urgent face to face visit, [facility] has five urgent care centers for your convenience:     [facility] [place] [facility] [place] Get Driving Directions [phone] [place], [place] [address] . 10 am - 6pm Monday - Friday    [facility]) Get Driving Directions [phone] [address] [place], [place] [address] . 10 am to 8 pm Monday-Friday . 12 pm to 8 pm [facility] Get Driving Directions [phone] [place], [place], [place] [address] . 8 am to 8 pm Monday-Friday . 9 am to 6 pm Saturday . 11 am to 6 pm Sunday     [facility] Get Driving Directions  [phone] [address] [place], [place] [address] . 8 am to 8 pm Monday-Friday . 8 am to 4 pm [facility] at [place] Get Driving Directions [phone] [address] [place], [place] [address] . 12 pm to 6 pm Monday-Friday      Your e-visit answers were reviewed by a board certified advanced clinical practitioner to complete your personal care plan.  Thank you for using e-Visits.

## 2020-04-27 ENCOUNTER — Telehealth: Payer: BC Managed Care – PPO | Admitting: Nurse Practitioner

## 2020-04-27 DIAGNOSIS — R197 Diarrhea, unspecified: Secondary | ICD-10-CM

## 2020-04-27 MED ORDER — AZITHROMYCIN 500 MG PO TABS: 500.0000 mg | ORAL_TABLET | Freq: Every day | ORAL | 0 refills | Status: AC

## 2020-04-27 NOTE — Progress Notes (Signed)
AWe are sorry that you are not feeling well.  Here is how we plan to help!  Based on what you have shared with me it looks like you have Acute Infectious Diarrhea.  Most cases of acute diarrhea are due to infections with virus and bacteria and are self-limited conditions lasting less than 14 days.  For your symptoms you may take Imodium 2 mg tablets that are over the counter at your local pharmacy. Take two tablet now and then one after each loose stool up to 6 a day.  Antibiotics are not needed for most people with diarrhea. I also recommend COVID-19 testing be performed based on your symptoms.  Optional: I have prescribed azithromycin 500 mg daily for 3 days  HOME CARE  We recommend changing your diet to help with your symptoms for the next few days.  Drink plenty of fluids that contain water salt and sugar. Sports drinks such as Gatorade may help.   You may try broths, soups, bananas, applesauce, soft breads, mashed potatoes or crackers.   You are considered infectious for as long as the diarrhea continues. Hand washing or use of alcohol based hand sanitizers is recommend.  It is best to stay out of work or school until your symptoms stop.   GET HELP RIGHT AWAY  If you have dark yellow colored urine or do not pass urine frequently you should drink more fluids.    If your symptoms worsen   If you feel like you are going to pass out (faint)  You have a new problem  MAKE SURE YOU   Understand these instructions.  Will watch your condition.  Will get help right away if you are not doing well or get worse.  Your e-visit answers were reviewed by a board certified advanced clinical practitioner to complete your personal care plan.  Depending on the condition, your plan could have included both over the counter or prescription medications.  If there is a problem please reply  once you have received a response from your provider.  Your safety is important to [place].  If you have  drug allergies check your prescription carefully.    You can use MyChart to ask questions about today's visit, request a non-urgent call back, or ask for a work or school excuse for 24 hours related to this e-Visit. If it has been greater than 24 hours you will need to follow up with your provider, or enter a new e-Visit to address those concerns.   You will get an e-mail in the next two days asking about your experience.  I hope that your e-visit has been valuable and will speed your recovery. Thank you for using e-visits.  I have spent at least 5 minutes reviewing and documenting in the patient's chart.

## 2020-06-05 ENCOUNTER — Telehealth: Payer: Self-pay | Admitting: Physician Assistant

## 2020-06-05 DIAGNOSIS — M549 Dorsalgia, unspecified: Secondary | ICD-10-CM

## 2020-06-06 NOTE — Progress Notes (Signed)
Based on what you shared with me, I feel your condition warrants further evaluation and I recommend that you be seen for a face to face office visit. Giving that the pain takes your breath and you note this is different from pain you are used to, this needs assessment with a detailed examination to make sure nothing more concerning is present and so proper care is given.    NOTE: If you entered your credit card information for this eVisit, you will not be charged. You may see a "hold" on your card for the $35 but that hold will drop off and you will not have a charge processed.   If you are having a true medical emergency please call 911.      For an urgent face to face visit, [place] has five urgent care centers for your convenience:     [facility] [place] [facility] [place] Get Driving Directions [phone] [place], [place] [address] . 10 am - 6pm Monday - Friday    [facility] [place] [facility]) Get Driving Directions [phone] [address] [place], [place] [address] . 10 am to 8 pm Monday-Friday . 12 pm to 8 pm [facility] Get Driving Directions [phone] [place], [place], [place] [address] . 8 am to 8 pm Monday-Friday . 9 am to 6 pm Saturday . 11 am to 6 pm Sunday     [facility] Get Driving Directions  [phone] [address] [place], [place] [address] . 8 am to 8 pm Monday-Friday . 8 am to 4 pm [facility] at [place] Get Driving Directions [phone] [address] [place],  [address] . 12 pm to 6 pm Monday-Friday      Your e-visit answers were reviewed by a board certified advanced clinical practitioner to complete your personal care plan.  Thank you for using e-Visits.

## 2022-10-19 ENCOUNTER — Emergency Department (HOSPITAL_COMMUNITY): Payer: BC Managed Care – PPO

## 2022-10-19 ENCOUNTER — Emergency Department (HOSPITAL_COMMUNITY)
Admission: EM | Admit: 2022-10-19 | Discharge: 2022-10-19 | Disposition: A | Discharge status: Home / Self Care | Payer: BC Managed Care – PPO | Attending: Emergency Medicine | Admitting: Emergency Medicine

## 2022-10-19 ENCOUNTER — Other Ambulatory Visit: Payer: Self-pay

## 2022-10-19 DIAGNOSIS — R42 Dizziness and giddiness: Secondary | ICD-10-CM | POA: Insufficient documentation

## 2022-10-19 DIAGNOSIS — R519 Headache, unspecified: Secondary | ICD-10-CM | POA: Diagnosis not present

## 2022-10-19 LAB — DIFFERENTIAL
Abs Immature Granulocytes: 0.02 10*3/uL (ref 0.00–0.07)
Basophils Absolute: 0 10*3/uL (ref 0.0–0.1)
Basophils Relative: 1 %
Eosinophils Absolute: 0.1 10*3/uL (ref 0.0–0.5)
Eosinophils Relative: 1 %
Immature Granulocytes: 0 %
Lymphocytes Relative: 19 %
Lymphs Abs: 1.5 10*3/uL (ref 0.7–4.0)
Monocytes Absolute: 0.4 10*3/uL (ref 0.1–1.0)
Monocytes Relative: 5 %
Neutro Abs: 6.1 10*3/uL (ref 1.7–7.7)
Neutrophils Relative %: 74 %

## 2022-10-19 LAB — CBC
HCT: 44.2 % (ref 36.0–46.0)
Hemoglobin: 14.8 g/dL (ref 12.0–15.0)
MCH: 30.7 pg (ref 26.0–34.0)
MCHC: 33.5 g/dL (ref 30.0–36.0)
MCV: 91.7 fL (ref 80.0–100.0)
Platelets: 210 10*3/uL (ref 150–400)
RBC: 4.82 MIL/uL (ref 3.87–5.11)
RDW: 12.5 % (ref 11.5–15.5)
WBC: 8.1 10*3/uL (ref 4.0–10.5)
nRBC: 0 % (ref 0.0–0.2)

## 2022-10-19 LAB — RAPID URINE DRUG SCREEN, HOSP PERFORMED
Amphetamines: POSITIVE — AB
Barbiturates: NOT DETECTED
Benzodiazepines: POSITIVE — AB
Cocaine: NOT DETECTED
Opiates: NOT DETECTED
Tetrahydrocannabinol: NOT DETECTED

## 2022-10-19 LAB — HCG, SERUM, QUALITATIVE: Preg, Serum: NEGATIVE

## 2022-10-19 LAB — COMPREHENSIVE METABOLIC PANEL
ALT: 14 U/L (ref 0–44)
AST: 15 U/L (ref 15–41)
Albumin: 4.3 g/dL (ref 3.5–5.0)
Alkaline Phosphatase: 65 U/L (ref 38–126)
Anion gap: 6 (ref 5–15)
BUN: 17 mg/dL (ref 6–20)
CO2: 26 mmol/L (ref 22–32)
Calcium: 9.4 mg/dL (ref 8.9–10.3)
Chloride: 104 mmol/L (ref 98–111)
Creatinine, Ser: 0.78 mg/dL (ref 0.44–1.00)
GFR, Estimated: >60 mL/min (ref >60)
Glucose, Bld: 87 mg/dL (ref 70–99)
Potassium: 4.1 mmol/L (ref 3.5–5.1)
Sodium: 136 mmol/L (ref 135–145)
Total Bilirubin: 0.6 mg/dL (ref 0.3–1.2)
Total Protein: 7.4 g/dL (ref 6.5–8.1)

## 2022-10-19 LAB — URINALYSIS, ROUTINE W REFLEX MICROSCOPIC
Bilirubin Urine: NEGATIVE
Glucose, UA: NEGATIVE mg/dL
Hgb urine dipstick: NEGATIVE
Ketones, ur: NEGATIVE mg/dL
Leukocytes,Ua: NEGATIVE
Nitrite: NEGATIVE
Protein, ur: NEGATIVE mg/dL
Specific Gravity, Urine: 1.02 (ref 1.005–1.030)
pH: 6 (ref 5.0–8.0)

## 2022-10-19 LAB — PROTIME-INR
INR: 0.9 (ref 0.8–1.2)
Prothrombin Time: 12.6 seconds (ref 11.4–15.2)

## 2022-10-19 LAB — ETHANOL: Alcohol, Ethyl (B): <10 mg/dL (ref <10)

## 2022-10-19 LAB — APTT: aPTT: 27 seconds (ref 24–36)

## 2022-10-19 MED ORDER — LORAZEPAM 1 MG PO TABS: 1.0000 mg | ORAL_TABLET | ORAL | 0 refills | Status: AC | PRN

## 2022-10-19 MED ORDER — SODIUM CHLORIDE 0.9 % IV BOLUS
500.0000 mL | Freq: Once | INTRAVENOUS | Status: AC
  Administered 2022-10-19: 500 mL via INTRAVENOUS

## 2022-10-19 MED ORDER — LORAZEPAM 2 MG/ML IJ SOLN
1.0000 mg | Freq: Once | INTRAMUSCULAR | Status: AC
  Administered 2022-10-19: 1 mg via INTRAVENOUS
  Filled 2022-10-19: qty 1

## 2022-10-19 MED ORDER — SODIUM CHLORIDE 0.9 % IV SOLN
100.0000 mL/h | INTRAVENOUS | Status: DC
  Administered 2022-10-19: 100 mL/h via INTRAVENOUS

## 2022-10-19 MED ORDER — MECLIZINE HCL 25 MG PO TABS: 25.0000 mg | ORAL_TABLET | Freq: Three times a day (TID) | ORAL | 0 refills | Status: AC

## 2022-10-19 NOTE — Discharge Instructions (Addendum)
As discussed, your evaluation today has been largely reassuring.  But, it is important that you monitor your condition carefully, and do not hesitate to return to the ED if you develop new, or concerning changes in your condition. ? ?Otherwise, please follow-up with your physician for appropriate ongoing care. ? ?

## 2022-10-19 NOTE — ED Provider Notes (Signed)
[place] EMERGENCY DEPARTMENT AT [facility] Provider Note   CSN: [id number] Arrival date & time: 10/19/22  4098     History  Chief Complaint  Patient presents with   Dizziness    Amber Blankenship is a 47 y.o. female.  HPI Presents with dizziness and headache. Patient states that she is generally well was so prior to this morning.  About 3 hours ago, after awakening, she felt dizzy, unsteady, and though she was able to go to work, with persistent unsteadiness including nearly falling from the commode, she is here for evaluation. She has subsequently developed headache, with photophobia, but has no vision changes.  Baseline patient does have poor vision with correction. Patient's family history is notable for mother with Angelita amyloid apathy.    Home Medications Prior to Admission medications   Medication Sig Start Date End Date Taking? Authorizing Provider  meclizine (ANTIVERT) 25 MG tablet Take 1 tablet (25 mg total) by mouth 3 (three) times daily for 7 days. 10/19/22 10/26/22 Yes Gerhard Munch, MD  docusate sodium (COLACE) 100 MG capsule Take 200 mg by mouth at bedtime.    [provider]  fluticasone (FLONASE) 50 MCG/ACT nasal spray Place 2 sprays into both nostrils daily. 01/25/20   Jannifer Rodney A, FNP  ibuprofen (ADVIL) 800 MG tablet Take 1 tablet (800 mg total) by mouth 3 (three) times daily. 08/14/19   Diamantina Monks, MD  Liraglutide -Weight Management (SAXENDA [place]) Inject into the skin.    [provider]  Lisdexamfetamine Dimesylate (VYVANSE PO) Take by mouth.    [provider]  LORazepam (ATIVAN) 1 MG tablet Take 1 tablet (1 mg total) by mouth as needed (vertigo). 10/19/22   Gerhard Munch, MD  omeprazole (PRILOSEC) 20 MG capsule TAKE 1 CAPSULE BY MOUTH EVERY DAY NEEDS APPT FOR REFILLS 02/12/19   Armbruster, Willaim Rayas, MD  ondansetron (ZOFRAN ODT) 4 MG disintegrating tablet Take 1 tablet (4 mg total) by mouth every 8 (eight)  hours as needed for nausea or vomiting. 04/07/17   Darci Current, MD  oxyCODONE (ROXICODONE) 5 MG immediate release tablet Take 1 tablet (5 mg total) by mouth every 4 (four) hours as needed. Alternate with tylenol/ibuprofen 08/14/19   Diamantina Monks, MD  polycarbophil (FIBERCON) 625 MG tablet Take 625 mg by mouth daily.    [provider]  predniSONE (STERAPRED UNI-PAK 21 TAB) 10 MG (21) TBPK tablet Use as directed 01/25/20   Jannifer Rodney A, FNP  zolpidem (AMBIEN) 10 MG tablet Take 10 mg by mouth daily.  03/29/16   [provider]      Allergies    Patient has no known allergies.    Review of Systems   Review of Systems  All other systems reviewed and are negative.   Physical Exam Updated Vital Signs BP 125/84   Pulse 90   Temp 97.8 F (36.6 C) (Oral)   Resp 16   Ht 5\' 6"  (1.676 m)   Wt 94 kg   SpO2 100%   BMI 33.45 kg/m  Physical Exam Vitals and nursing note reviewed.  Constitutional:      General: She is not in acute distress.    Appearance: She is well-developed.     Comments: Uncomfortable appearing adult female awake and alert  HENT:     Head: Normocephalic and atraumatic.  Eyes:     Conjunctiva/sclera: Conjunctivae normal.  Cardiovascular:     Rate and Rhythm: Normal rate and regular rhythm.  Pulmonary:     Effort: Pulmonary effort is normal. No respiratory distress.     Breath sounds: Normal breath sounds. No stridor.  Abdominal:     General: There is no distension.  Skin:    General: Skin is warm and dry.  Neurological:     Mental Status: She is alert and oriented to person, place, and time.     Cranial Nerves: No cranial nerve deficit.     Comments: Patient feels as though grip on the left is stronger though she is right-handed, no discernible difference on my exam, no pronator drift, no lower extremity asymmetry, no facial extremity, speech is clear.  Psychiatric:        Mood and Affect: Mood normal.     ED Results / Procedures /  Treatments   Labs (all labs ordered are listed, but only abnormal results are displayed) Labs Reviewed  RAPID URINE DRUG SCREEN, HOSP PERFORMED - Abnormal; Notable for the following components:      Result Value   Benzodiazepines POSITIVE (*)    Amphetamines POSITIVE (*)    All other components within normal limits  PROTIME-INR  APTT  CBC  DIFFERENTIAL  COMPREHENSIVE METABOLIC PANEL  ETHANOL  URINALYSIS, ROUTINE W REFLEX MICROSCOPIC  HCG, SERUM, QUALITATIVE    EKG EKG Interpretation Date/Time:  Tuesday October 19 2022 08:54:00 EDT Ventricular Rate:  61 PR Interval:  133 QRS Duration:  93 QT Interval:  419 QTC Calculation: 422 R Axis:   75  Text Interpretation: Sinus rhythm Low voltage, precordial leads Confirmed by Gerhard Munch [phone]) on 10/19/2022 9:20:33 AM  Radiology MR BRAIN WO CONTRAST  Result Date: 10/19/2022 CLINICAL DATA:  47 year old female with headache and disequilibrium. Dizziness since July 15th. EXAM: MRI HEAD WITHOUT CONTRAST TECHNIQUE: Multiplanar, multiecho pulse sequences of the brain and surrounding structures were obtained without intravenous contrast. COMPARISON:  None Available. FINDINGS: Brain: Normal cerebral volume. No restricted diffusion to suggest acute infarction. No midline shift, mass effect, evidence of mass lesion, ventriculomegaly, extra-axial collection or acute intracranial hemorrhage. Cervicomedullary junction and pituitary are within normal limits. Wallace Cullens and white matter signal is normal throughout the brain. No encephalomalacia or chronic cerebral blood products. Vascular: Major intracranial vascular flow voids are preserved. Skull and upper cervical spine: Negative. Visualized bone marrow signal is within normal limits. Sinuses/Orbits: Negative orbits.  Paranasal sinuses are clear. Other: Bilateral mastoids appear clear. Visible internal auditory structures appear normal. Negative visible scalp and face. IMPRESSION: Normal noncontrast MRI  appearance of the Brain. Electronically Signed   By: Odessa Fleming M.D.   On: 10/19/2022 11:47    Procedures Procedures    Medications Ordered in ED Medications  sodium chloride 0.9 % bolus 500 mL (0 mLs Intravenous Stopped 10/19/22 1107)    Followed by  0.9 %  sodium chloride infusion (100 mL/hr Intravenous New Bag/Given 10/19/22 1044)  LORazepam (ATIVAN) injection 1 mg (1 mg Intravenous Given 10/19/22 1054)    ED Course/ Medical Decision Making/ A&P                                 Medical Decision Making Previously well adult female presents with acute onset dizziness, development of headache.  Patient is not on blood thinning medication, but with family history of amyloid apathy, as well as atypical presentation for vertigo, differential including stroke, mass, vertigo considered. MRI ordered, labs sent, symptomatic control initiated. Cardiac 70 sinus normal Pulse ox 100%  room air normal   Amount and/or Complexity of Data Reviewed Independent Historian: spouse External Data Reviewed: notes. Labs: ordered. Decision-making details documented in ED Course. Radiology: ordered and independent interpretation performed. Decision-making details documented in ED Course. ECG/medicine tests: ordered and independent interpretation performed. Decision-making details documented in ED Course.  Risk Prescription drug management. Decision regarding hospitalization.   12:39 PM Patient in no distress, not moving her head slightly more freely, sleepy but feels better.  Findings reviewed, discussed, no evidence for acute CVA, mass, hemorrhage or other acute findings.  Suspicion for acute vertigo given her description of symptoms.  Patient started meclizine, Ativan, will follow-up with primary care.        Final Clinical Impression(s) / ED Diagnoses Final diagnoses:  Bad headache  Dizziness    Rx / DC Orders ED Discharge Orders          Ordered    meclizine (ANTIVERT) 25 MG tablet  3  times daily        10/19/22 1238    LORazepam (ATIVAN) 1 MG tablet  As needed        10/19/22 1238              Gerhard Munch, MD 10/19/22 1239

## 2022-10-19 NOTE — ED Notes (Signed)
Pt transported to MRI 

## 2022-10-19 NOTE — ED Triage Notes (Signed)
Pt reports dizziness " like the room is spinning" since 0715.

## 2023-07-12 ENCOUNTER — Ambulatory Visit (INDEPENDENT_AMBULATORY_CARE_PROVIDER_SITE_OTHER): Payer: Self-pay | Admitting: Otolaryngology

## 2023-07-12 VITALS — BP 116/81 | HR 82 | Ht 65.0 in | Wt 185.0 lb

## 2023-07-12 DIAGNOSIS — J342 Deviated nasal septum: Secondary | ICD-10-CM | POA: Diagnosis not present

## 2023-07-12 DIAGNOSIS — J343 Hypertrophy of nasal turbinates: Secondary | ICD-10-CM | POA: Diagnosis not present

## 2023-07-12 DIAGNOSIS — H6982 Other specified disorders of Eustachian tube, left ear: Secondary | ICD-10-CM

## 2023-07-12 DIAGNOSIS — J310 Chronic rhinitis: Secondary | ICD-10-CM

## 2023-07-12 DIAGNOSIS — R0981 Nasal congestion: Secondary | ICD-10-CM

## 2023-07-12 MED ORDER — BUDESONIDE 32 MCG/ACT NA SUSP: 2.0000 | Freq: Every day | NASAL | 10 refills | Status: AC

## 2023-07-14 DIAGNOSIS — H6982 Other specified disorders of Eustachian tube, left ear: Secondary | ICD-10-CM | POA: Insufficient documentation

## 2023-07-14 DIAGNOSIS — J310 Chronic rhinitis: Secondary | ICD-10-CM | POA: Insufficient documentation

## 2023-07-14 DIAGNOSIS — J342 Deviated nasal septum: Secondary | ICD-10-CM | POA: Insufficient documentation

## 2023-07-14 DIAGNOSIS — J343 Hypertrophy of nasal turbinates: Secondary | ICD-10-CM | POA: Insufficient documentation

## 2023-07-14 NOTE — Progress Notes (Signed)
 Patient ID: Amber Blankenship, female   DOB: August 24, 1975, 48 y.o.   MRN: [id number]  Follow-up: Left ear eustachian tube dysfunction, recurrent sinusitis  HPI: The patient is a 48 year old female who returns today complaining of clogging sensation in her left ear for the past month.  The patient was last seen in March 2024.  At that time, she was complaining of recurrent sinusitis and left middle ear effusion.  She was noted to have eustachian tube dysfunction, nasal mucosal congestion, nasal septal deviation, and bilateral inferior turbinate hypertrophy.  No acute infection was noted at that time.  She was treated with Flonase  nasal spray and Valsalva exercise.  According to the patient, she was doing well until 1 month ago, when she started experiencing increasing clogging sensation in her left ear, with muffled hearing.  She was seen at an urgent care center, and was treated with Z-Pak, Allegra, and Flonase .  However, the patient does not like to use Flonase  due to the flowery scent.  She is currently on Zyrtec daily.  Her symptoms have moderately improved.  Exam: General: Communicates without difficulty, well nourished, no acute distress. Head: Normocephalic, no evidence injury, no tenderness, facial buttresses intact without stepoff. Face/sinus: No tenderness to palpation and percussion. Facial movement is normal and symmetric. Eyes: PERRL, EOMI. No scleral icterus, conjunctivae clear. Neuro: CN II exam reveals vision grossly intact.  No nystagmus at any point of gaze. Ears: Auricles well formed without lesions.  Ear canals are intact without mass or lesion.  No erythema or edema is appreciated.  The TMs are intact without fluid. Nose: External evaluation reveals normal support and skin without lesions.  Dorsum is intact.  Anterior rhinoscopy reveals congested mucosa over anterior aspect of inferior turbinates and deviated septum.  No purulence noted. Oral:  Oral cavity and oropharynx are intact, symmetric,  without erythema or edema.  Mucosa is moist without lesions. Neck: Full range of motion without pain.  There is no significant lymphadenopathy.  No masses palpable.  Thyroid bed within normal limits to palpation.  Parotid glands and submandibular glands equal bilaterally without mass.  Trachea is midline. Neuro:  CN 2-12 grossly intact.   Assessment: 1.  The patient's history and physical exam findings are consistent with left ear eustachian tube dysfunction.  Her ear canals, tympanic membranes, and middle ear spaces are otherwise normal. 2.  Chronic rhinitis with nasal mucosal congestion, nasal septal deviation, and bilateral inferior turbinate hypertrophy. 3.  No acute infection is noted today.  Plan: 1.  The physical exam findings are reviewed with the patient. 2.  Rhinocort nasal spray 2 sprays each nostril daily.  The importance of consistent daily use is discussed. 3.  Valsalva exercise multiple times a day. 4.  The patient will return for reevaluation in 1 month if she continues to be symptomatic.
